# Patient Record
Sex: Female | Born: 1955 | Hispanic: Yes
Health system: Midwestern US, Academic
[De-identification: ages and names within clinical notes are randomized; demographics above are authoritative.]

## PROBLEM LIST (undated history)

## (undated) DIAGNOSIS — I10 Essential (primary) hypertension: Secondary | ICD-10-CM

## (undated) DIAGNOSIS — K5792 Diverticulitis of intestine, part unspecified, without perforation or abscess without bleeding: Secondary | ICD-10-CM

## (undated) HISTORY — PX: APPENDECTOMY: SHX54

---

## 2003-12-23 ENCOUNTER — Other Ambulatory Visit: Payer: Self-pay

## 2004-06-21 ENCOUNTER — Other Ambulatory Visit: Payer: Self-pay

## 2005-10-17 ENCOUNTER — Ambulatory Visit: Payer: Self-pay

## 2006-05-08 ENCOUNTER — Ambulatory Visit: Payer: Self-pay | Admitting: Pediatrics

## 2007-03-14 ENCOUNTER — Other Ambulatory Visit: Payer: Self-pay

## 2007-03-14 ENCOUNTER — Emergency Department: Payer: Self-pay | Admitting: Emergency Medicine

## 2007-08-23 ENCOUNTER — Ambulatory Visit: Payer: Self-pay | Admitting: Internal Medicine

## 2008-04-27 ENCOUNTER — Ambulatory Visit: Payer: Self-pay

## 2008-05-07 ENCOUNTER — Ambulatory Visit: Payer: Self-pay

## 2008-09-27 ENCOUNTER — Ambulatory Visit: Payer: Self-pay | Admitting: Family Medicine

## 2009-12-27 ENCOUNTER — Ambulatory Visit: Payer: Self-pay

## 2013-02-25 ENCOUNTER — Ambulatory Visit: Payer: Self-pay

## 2013-03-23 ENCOUNTER — Ambulatory Visit: Payer: Self-pay

## 2016-02-16 ENCOUNTER — Emergency Department: Admit: 2016-02-17 | Payer: MEDICAID

## 2016-02-16 DIAGNOSIS — M7071 Other bursitis of hip, right hip: Secondary | ICD-10-CM

## 2016-02-16 MED ORDER — traMADol (ULTRAM) 50 mg tablet
50 | ORAL_TABLET | Freq: Four times a day (QID) | ORAL | Status: AC | PRN
Start: 2016-02-16 — End: ?

## 2016-02-16 MED ORDER — methylPREDNISolone (MEDROL DOSEPACK) 4 mg tablet
4 | PACK | ORAL | Status: AC
Start: 2016-02-16 — End: ?

## 2016-02-16 MED ORDER — traMADol (ULTRAM) tablet 50 mg
50 | Freq: Once | ORAL | Status: AC
Start: 2016-02-16 — End: 2016-02-16
  Administered 2016-02-16: 23:00:00 50 mg via ORAL

## 2016-02-16 MED ORDER — predniSONE (DELTASONE) tablet 60 mg
20 | Freq: Once | ORAL | Status: AC
Start: 2016-02-16 — End: 2016-02-16
  Administered 2016-02-17: 03:00:00 60 mg via ORAL

## 2016-02-16 MED FILL — TRAMADOL 50 MG TABLET: 50 50 mg | ORAL | Qty: 1

## 2016-02-16 MED FILL — PREDNISONE 20 MG TABLET: 20 20 MG | ORAL | Qty: 3

## 2016-02-16 NOTE — Unmapped (Signed)
Patient to xray

## 2016-02-16 NOTE — Unmapped (Signed)
Pt with right midline pain x 2 months , worse in last 2 weeks. Pt with lump on outer midline aspect, very painful to palpation.

## 2016-02-16 NOTE — Unmapped (Signed)
Dr. Stettler at bedside

## 2016-02-16 NOTE — Unmapped (Signed)
[place] ED Note    Reason for Visit: Leg Pain and Hip Pain      Patient History     HPI:  Connie Montgomery is a 60 y.o. female who presents with right hip pain that radiates down her leg and has been present for 2 months, although became worse last night. The patient describes a stabbing pain that begins on the outside of her right hip and radiates down her leg.  It does not change with walking and is present with any movement, although does seem to improve somewhat with rest.  The pain has been very intermittent, although became more severe today.  The patient lives in [place] and travels frequently, although is staying in [place] to help care for her grandchildren and is to return to [place] next week.  They have noted no swelling to the leg and the patient denies any chest pain or shortness of breath.  The patient has been using some sort of topical patch to help with the pain, although this does not help to any significant extent.  The patient's noted no fevers and no overlying redness.       Past Medical History   Diagnosis Date   ??? Hypertension    ??? Cancer        Past Surgical History   Procedure Laterality Date   ??? Appendectomy         Connie Montgomery  reports that she has quit smoking. She does not have any smokeless tobacco history on file. She reports that she does not drink alcohol or use illicit drugs.    Previous Medications    HYDROCHLOROTHIAZIDE (MICROZIDE) 12.5 MG CAPSULE    Take 12.5 mg by mouth daily.       Allergies:   Allergies as of 02/16/2016 - Fully Reviewed 02/16/2016   Allergen Reaction Noted   ??? Amoxicillin  02/16/2016       Review of Systems     ROS: Positive as above. All other systems are reviewed and are negative.       Physical Exam     ED Triage Vitals   Vital Signs Group      Temp 02/16/16 2042 96.8 ??F (36 ??C)      Temp Source 02/16/16 2042 Oral      Heart Rate 02/16/16 2042 89      Heart Rate Source 02/16/16 2042 Monitor       Resp 02/16/16 2042 20      SpO2 02/16/16 2042 97 %      BP 02/16/16 2042 147/90 mmHg      BP Location 02/16/16 2042 Right arm      BP Method 02/16/16 2042 Automatic      Patient Position 02/16/16 2042 Sitting   SpO2 02/16/16 2042 97 %   O2 Device 02/16/16 2042 None (Room air)       General: Well-appearing female in no distress.    HEENT: Pupils are equally round and briskly reactive to light.  Extraocular muscles are intact.  Oral mucous membranes are moist without lesions.  There is no evidence of contusion, laceration, or abrasion about the head or neck.    Neck:  No JVD or adenopathy is noted.  The patient has full range of motion without difficulty.    Pulmonary:  Lungs are clear to auscultation bilaterally.  There are no rhonchi rales or wheezes.  There is no subcutaneous air or crepitus.  The chest wall is nontender.  Cardiac:  Regular rate and rhythm.  No murmurs, rubs, or gallops.  Distal pulses are brisk.    Abdomen:  Abdomen is soft, nontender, nondistended.  Bowel sounds are positive.  No organomegaly or masses are appreciated.    Musculoskeletal: Mild to moderate tenderness is noted overlying the right greater trochanter.  There is no overlying skin change or rash.  No warmth or erythema.  There is no swelling noted to the right lower extremity and distal pulses are bounding.  There is no pain with logroll.  Range of motion at the hip and ankle are normal.    Vascular:  Pulses are brisk and equal in all 4 extremities.     Skin: No rashes are noted. Skin is warm, dry, and without lesions    Neuro:  The patient has intact strength in all 4 extremities.  There are no deficits of the cranial nerves.  Gait is normal without assistance.  The visual fields are intact by direct challenge. There is no evidence of ataxia or dysmetria on exam. Speech is fluent without dysarthria.    Psych: Mental status and interactions are normal.      Diagnostic Studies     Labs:    D-dimer is below concerning  threshold.    Radiology:    Plain film of the hip reveals no evidence of fracture, dislocation, or significant arthritic changes      Emergency Department Procedures         ED Course and MDM     Connie Montgomery is a 60 y.o. female who presented to the emergency department with Leg Pain and Hip Pain    The patient presents with a history and exam which are most consistent with a trochanteric bursitis.  Her plain film of the hip is negative and she is able to bear weight.  She underwent a d-dimer, given her travel, despite the absence of leg swelling, which was below the concerning range.  She'll be treated symptomatically for bursitis and advised to return for any worsening symptoms, including swelling, worsening pain, fever, or other concerns.  She will follow up with her primary care physician upon her return home.      Critical Care Time (Attendings)         Claudette Stapler, MD  02/17/16 [phone]

## 2016-02-16 NOTE — Unmapped (Signed)
Patient is post menopausal

## 2016-02-17 ENCOUNTER — Inpatient Hospital Stay: Admit: 2016-02-17 | Discharge: 2016-02-17 | Disposition: A | Discharge status: Home / Self Care | Payer: MEDICAID

## 2016-02-17 LAB — DDIMER: D-Dimer: 0.46 ug/mL FEU (ref 0.00–0.50)

## 2020-08-09 ENCOUNTER — Ambulatory Visit
Admission: EM | Admit: 2020-08-09 | Discharge: 2020-08-09 | Disposition: A | Discharge status: Home / Self Care | Payer: Self-pay | Attending: Family Medicine | Admitting: Family Medicine

## 2020-08-09 ENCOUNTER — Ambulatory Visit (INDEPENDENT_AMBULATORY_CARE_PROVIDER_SITE_OTHER)
Admit: 2020-08-09 | Discharge: 2020-08-09 | Disposition: A | Discharge status: Home / Self Care | Payer: Self-pay | Attending: Family Medicine | Admitting: Family Medicine

## 2020-08-09 ENCOUNTER — Other Ambulatory Visit: Payer: Self-pay

## 2020-08-09 DIAGNOSIS — K5792 Diverticulitis of intestine, part unspecified, without perforation or abscess without bleeding: Secondary | ICD-10-CM

## 2020-08-09 HISTORY — DX: Essential (primary) hypertension: I10

## 2020-08-09 LAB — URINALYSIS, COMPLETE (UACMP) WITH MICROSCOPIC
Bilirubin Urine: NEGATIVE
Glucose, UA: NEGATIVE mg/dL
Ketones, ur: NEGATIVE mg/dL
Leukocytes,Ua: NEGATIVE
Nitrite: NEGATIVE
Protein, ur: NEGATIVE mg/dL
Specific Gravity, Urine: 1.02 (ref 1.005–1.030)
pH: 7 (ref 5.0–8.0)

## 2020-08-09 LAB — CBC WITH DIFFERENTIAL/PLATELET
Abs Immature Granulocytes: 0.04 10*3/uL (ref 0.00–0.07)
Basophils Absolute: 0.1 10*3/uL (ref 0.0–0.1)
Basophils Relative: 1 %
Eosinophils Absolute: 0.1 10*3/uL (ref 0.0–0.5)
Eosinophils Relative: 1 %
HCT: 37.3 % (ref 36.0–46.0)
Hemoglobin: 12.6 g/dL (ref 12.0–15.0)
Immature Granulocytes: 0 %
Lymphocytes Relative: 45 %
Lymphs Abs: 6.3 10*3/uL — ABNORMAL HIGH (ref 0.7–4.0)
MCH: 29.4 pg (ref 26.0–34.0)
MCHC: 33.8 g/dL (ref 30.0–36.0)
MCV: 86.9 fL (ref 80.0–100.0)
Monocytes Absolute: 0.9 10*3/uL (ref 0.1–1.0)
Monocytes Relative: 6 %
Neutro Abs: 6.7 10*3/uL (ref 1.7–7.7)
Neutrophils Relative %: 47 %
Platelets: 225 10*3/uL (ref 150–400)
RBC Morphology: NONE SEEN
RBC: 4.29 MIL/uL (ref 3.87–5.11)
RDW: 13.2 % (ref 11.5–15.5)
Smear Review: NORMAL
WBC Morphology: ABNORMAL
WBC: 14 10*3/uL — ABNORMAL HIGH (ref 4.0–10.5)
nRBC: 0 % (ref 0.0–0.2)

## 2020-08-09 LAB — COMPREHENSIVE METABOLIC PANEL
ALT: 25 U/L (ref 0–44)
AST: 18 U/L (ref 15–41)
Albumin: 3.9 g/dL (ref 3.5–5.0)
Alkaline Phosphatase: 66 U/L (ref 38–126)
Anion gap: 10 (ref 5–15)
BUN: 11 mg/dL (ref 8–23)
CO2: 28 mmol/L (ref 22–32)
Calcium: 8.7 mg/dL — ABNORMAL LOW (ref 8.9–10.3)
Chloride: 99 mmol/L (ref 98–111)
Creatinine, Ser: 0.71 mg/dL (ref 0.44–1.00)
GFR calc Af Amer: >60 mL/min (ref >60)
GFR calc non Af Amer: >60 mL/min (ref >60)
Glucose, Bld: 93 mg/dL (ref 70–99)
Potassium: 3.3 mmol/L — ABNORMAL LOW (ref 3.5–5.1)
Sodium: 137 mmol/L (ref 135–145)
Total Bilirubin: 0.7 mg/dL (ref 0.3–1.2)
Total Protein: 7.9 g/dL (ref 6.5–8.1)

## 2020-08-09 MED ORDER — CIPROFLOXACIN HCL 500 MG PO TABS: 500.0000 mg | ORAL_TABLET | Freq: Two times a day (BID) | ORAL | 0 refills | Status: AC

## 2020-08-09 MED ORDER — METRONIDAZOLE 500 MG PO TABS: 500.0000 mg | ORAL_TABLET | Freq: Three times a day (TID) | ORAL | 0 refills | Status: AC

## 2020-08-09 NOTE — ED Provider Notes (Signed)
[facility]    CSN: [id number] Arrival date & time: 08/09/20  1114      History   Chief Complaint Chief Complaint  Patient presents with  . Urinary Tract Infection   HPI  64 year old female presents with urinary frequency, abdominal pain, and fever.  Symptoms started yesterday.  She reports lower abdominal pain, urinary frequency.  Denies dysuria.  She reports mild back pain.  She had a fever yesterday of 100.5.  Temperature currently 9.9.  Pain 8/10 in severity.  No relieving factors.  Patient is concerned that she has UTI.  No relieving factors.  No nausea vomiting.  No respiratory symptoms.  No other complaints or concerns at this time.  Past Medical History:  Diagnosis Date  . Hypertension    Past Surgical History:  Procedure Laterality Date  . APPENDECTOMY     OB History   No obstetric history on file.    Home Medications    Prior to Admission medications   Medication Sig Start Date End Date Taking? Authorizing Provider  ciprofloxacin (CIPRO) 500 MG tablet Take 1 tablet (500 mg total) by mouth 2 (two) times daily for 10 days. 08/09/20 08/19/20  Coral Spikes, DO  hydrochlorothiazide (HYDRODIURIL) 25 MG tablet Take by mouth.    [provider]  metroNIDAZOLE (FLAGYL) 500 MG tablet Take 1 tablet (500 mg total) by mouth 3 (three) times daily for 10 days. 08/09/20 08/19/20  Coral Spikes, DO   Social History Social History   Tobacco Use  . Smoking status: Never Smoker  . Smokeless tobacco: Never Used  Substance Use Topics  . Alcohol use: Never  . Drug use: Not on file     Allergies   Amoxicillin   Review of Systems Review of Systems  Constitutional: Positive for fever.  Gastrointestinal: Positive for abdominal pain.  Genitourinary: Positive for frequency.   Physical Exam Triage Vital Signs ED Triage Vitals [08/09/20 1221]  Enc Vitals Group     BP (!) 142/81     Pulse Rate (!) 101     Resp 18     Temp 99.9 F (37.7 C)     Temp  Source Oral     SpO2 99 %     Weight 188 lb (85.3 kg)     Height 5' 2.5" (1.588 m)     Head Circumference      Peak Flow      Pain Score 8     Pain Loc      Pain Edu?      Excl. in [place]?    Updated Vital Signs BP (!) 142/81 (BP Location: Right Arm)   Pulse (!) 101   Temp 99.9 F (37.7 C) (Oral)   Resp 18   Ht 5' 2.5" (1.588 m)   Wt 85.3 kg   SpO2 99%   BMI 33.84 kg/m   Visual Acuity Right Eye Distance:   Left Eye Distance:   Bilateral Distance:    Right Eye Near:   Left Eye Near:    Bilateral Near:     Physical Exam Vitals and nursing note reviewed.  Constitutional:      General: She is not in acute distress.    Appearance: Normal appearance. She is not ill-appearing.  HENT:     Head: Normocephalic and atraumatic.  Eyes:     General:        Right eye: No discharge.        Left eye: No discharge.  Conjunctiva/sclera: Conjunctivae normal.  Cardiovascular:     Rate and Rhythm: Regular rhythm. Tachycardia present.  Pulmonary:     Effort: Pulmonary effort is normal.     Breath sounds: Normal breath sounds.  Abdominal:     General: There is no distension.     Palpations: Abdomen is soft.     Comments: Tender location in the suprapubic region as well as the left lower quadrant.  Neurological:     Mental Status: She is alert.  Psychiatric:        Mood and Affect: Mood normal.        Behavior: Behavior normal.    UC Treatments / Results  Labs (all labs ordered are listed, but only abnormal results are displayed) Labs Reviewed  URINALYSIS, COMPLETE (UACMP) WITH MICROSCOPIC - Abnormal; Notable for the following components:      Result Value   Hgb urine dipstick SMALL (*)    Bacteria, UA RARE (*)    All other components within normal limits  CBC WITH DIFFERENTIAL/PLATELET - Abnormal; Notable for the following components:   WBC 14.0 (*)    Lymphs Abs 6.3 (*)    All other components within normal limits  COMPREHENSIVE METABOLIC PANEL - Abnormal; Notable for  the following components:   Potassium 3.3 (*)    Calcium 8.7 (*)    All other components within normal limits  URINE CULTURE  PATHOLOGIST SMEAR REVIEW    EKG   Radiology CT ABDOMEN PELVIS WO CONTRAST  Result Date: 08/09/2020 CLINICAL DATA:  Hematuria.  Fever.  Elevated white blood cell count. EXAM: CT ABDOMEN AND PELVIS WITHOUT CONTRAST TECHNIQUE: Multidetector CT imaging of the abdomen and pelvis was performed following the standard protocol without oral or IV contrast. COMPARISON:  None. FINDINGS: Lower chest: Lung bases are clear. Hepatobiliary: There is hepatic steatosis. No focal liver lesions are evident on this noncontrast enhanced study. The gallbladder wall is not appreciably thickened. There is no biliary duct dilatation. Pancreas: There is no pancreatic mass or inflammatory focus. Spleen: No splenic lesions are evident. Adrenals/Urinary Tract: Adrenals bilaterally appear normal. Kidneys bilaterally show no evident mass or hydronephrosis on either side. There is no appreciable renal or ureteral calculus on either side. Urinary bladder is midline with wall thickness within normal limits. Stomach/Bowel: There are multiple sigmoid diverticula. There is localized wall thickness in the mid sigmoid colon in the left mid abdomen. Irregular diverticulum noted in this area. Adjacent to this area, there is irregular soft tissue stranding with a small focus of loculated fluid measuring 1.4 x 1.6 cm. No well-defined abscess. No extraluminal air in this area. No other bowel wall thickening or bowel inflammatory change noted. No bowel obstruction. The terminal ileum appears normal. There is no appreciable free air or portal venous air. Vascular/Lymphatic: There is no abdominal aortic aneurysm. There is aortic atherosclerosis. No adenopathy is appreciable in the abdomen or pelvis. Reproductive: The uterus is anteverted. A small calcification in the anterior uterus may represent a tiny leiomyoma. No other  pelvic mass. Note that inflammation from the diverticulitis in the sigmoid colon is adjacent to the left adnexa. Other: Appendix region appears normal without inflammation. There are calcifications in the periappendiceal region. There is no frank abscess in the abdomen pelvis. No ascites evident in the abdomen or pelvis. Musculoskeletal: No blastic or lytic bone lesions. There are foci of degenerative change in the lumbar spine. There are no intramuscular or abdominal wall lesions. IMPRESSION: 1. Diverticulitis in the mid sigmoid colon region.  There is focal soft tissue stranding and mild fluid posterior to and irregular diverticulum in the mid sigmoid colon, likely sequelae of diverticulitis. No extraluminal air or well-defined abscess in this area. Early developing phlegmon in this area must be of concern, however. 2. No bowel obstruction. No frank abscess in the abdomen or pelvis. No periappendiceal region inflammation. 3. No renal or ureteral calculus. No hydronephrosis. Urinary bladder wall thickness normal. 4.  Hepatic steatosis. 5.  Aortic Atherosclerosis (ICD10-I70.0). These results will be called to the ordering clinician or representative by the Radiologist Assistant, and communication documented in the PACS or [employer]. Electronically Signed   By: Lowella Grip III M.D.   On: 08/09/2020 14:12    Procedures Procedures (including critical care time)  Medications Ordered in UC Medications - No data to display  Initial Impression / Assessment and Plan / UC Course  I have reviewed the triage vital signs and the nursing notes.  Pertinent labs & imaging results that were available during my care of the patient were reviewed by me and considered in my medical decision making (see chart for details).    64 year old female presents with fever and lower abdominal pain.  Patient concern for UTI.  Urinalysis not consistent with UTI.  Awaiting culture.  Labs obtained and revealed leukocytosis  of 14.  CT abdomen pelvis was obtained and revealed acute diverticulitis of the sigmoid colon and concern for possible developing phlegmon.  Case was discussed with general surgery Dr. Hampton Abbot.  He looked at the CT scan and did not feel that she warranted surgical intervention and hospitalization at this time.  He agreed with outpatient antibiotics and clear liquid diet.  Treating with Cipro and Flagyl.  Clear liquid diet for 48 hours.  Patient will be reevaluated here in 48 hours.  She is to go to the hospital if she worsens.  Final Clinical Impressions(s) / UC Diagnoses   Final diagnoses:  Acute diverticulitis     Discharge Instructions     Medication as directed.  Clear liquid diet for 2 days.  If you worsen, go to the ER.  Take care  Dr. Lacinda Axon    ED Prescriptions    Medication Sig Dispense Auth. Provider   ciprofloxacin (CIPRO) 500 MG tablet Take 1 tablet (500 mg total) by mouth 2 (two) times daily for 10 days. 20 tablet Delontae Lamm G, DO   metroNIDAZOLE (FLAGYL) 500 MG tablet Take 1 tablet (500 mg total) by mouth 3 (three) times daily for 10 days. 30 tablet Coral Spikes, DO     PDMP not reviewed this encounter.   Coral Spikes, DO 08/09/20 1455

## 2020-08-09 NOTE — ED Triage Notes (Signed)
Patient in today w/ c/o UTI sx, w/ frequency, and a fever. Sx onset yesterday.

## 2020-08-09 NOTE — Discharge Instructions (Signed)
Medication as directed.  Clear liquid diet for 2 days.  If you worsen, go to the ER.  Take care  Dr. Lacinda Axon

## 2020-08-10 LAB — URINE CULTURE

## 2020-08-10 LAB — PATHOLOGIST SMEAR REVIEW

## 2020-08-11 ENCOUNTER — Ambulatory Visit
Admission: EM | Admit: 2020-08-11 | Discharge: 2020-08-11 | Disposition: A | Discharge status: Home / Self Care | Payer: Self-pay | Attending: Family Medicine | Admitting: Family Medicine

## 2020-08-11 ENCOUNTER — Other Ambulatory Visit: Payer: Self-pay

## 2020-08-11 DIAGNOSIS — K5792 Diverticulitis of intestine, part unspecified, without perforation or abscess without bleeding: Secondary | ICD-10-CM

## 2020-08-11 HISTORY — DX: Diverticulitis of intestine, part unspecified, without perforation or abscess without bleeding: K57.92

## 2020-08-11 MED ORDER — ONDANSETRON 4 MG PO TBDP: 4.0000 mg | ORAL_TABLET | Freq: Three times a day (TID) | ORAL | 0 refills | Status: DC | PRN

## 2020-08-11 NOTE — ED Triage Notes (Addendum)
Pt reports she is here for follow up after being seen for abdominal pain. Pt has been taking clear liquids and jello for past 3 days. Pain is very mild compared to when she was hear before. Endorses nausea

## 2020-08-11 NOTE — ED Provider Notes (Signed)
[facility]    CSN: [id number] Arrival date & time: 08/11/20  1113      History   Chief Complaint Chief Complaint  Patient presents with  . Follow-up   HPI  64 year old female presents for follow up.  Patient recently seen by me on 9/14.  CT scan was done and revealed diverticulitis with possible developing phlegmon.  Case was discussed with general surgery.  He reviewed the CT scan and felt comfortable with outpatient management.  Patient reports compliance with prescribed antibiotics.  She reports that she has been eating clear liquid diet as directed.  She reports associated nausea from the medication.  Abdominal pain improved.  No fever.  Pain currently 3/10 in severity.  No other associated symptoms.  No other complaints.  Past Medical History:  Diagnosis Date  . Diverticulitis   . Hypertension    Past Surgical History:  Procedure Laterality Date  . APPENDECTOMY      OB History   No obstetric history on file.      Home Medications    Prior to Admission medications   Medication Sig Start Date End Date Taking? Authorizing Provider  ciprofloxacin (CIPRO) 500 MG tablet Take 1 tablet (500 mg total) by mouth 2 (two) times daily for 10 days. 08/09/20 08/19/20  Coral Spikes, DO  hydrochlorothiazide (HYDRODIURIL) 25 MG tablet Take by mouth.    [provider]  metroNIDAZOLE (FLAGYL) 500 MG tablet Take 1 tablet (500 mg total) by mouth 3 (three) times daily for 10 days. 08/09/20 08/19/20  Coral Spikes, DO  ondansetron (ZOFRAN ODT) 4 MG disintegrating tablet Take 1 tablet (4 mg total) by mouth every 8 (eight) hours as needed for nausea or vomiting. 08/11/20   Coral Spikes, DO   Social History Social History   Tobacco Use  . Smoking status: Never Smoker  . Smokeless tobacco: Never Used  Vaping Use  . Vaping Use: Never used  Substance Use Topics  . Alcohol use: Not Currently  . Drug use: Never     Allergies   Amoxicillin   Review of  Systems Review of Systems  Constitutional: Negative for fever.  Gastrointestinal: Positive for abdominal pain and nausea.   Physical Exam Triage Vital Signs ED Triage Vitals  Enc Vitals Group     BP 08/11/20 1141 130/61     Pulse Rate 08/11/20 1141 79     Resp 08/11/20 1141 18     Temp 08/11/20 1141 98.3 F (36.8 C)     Temp Source 08/11/20 1141 Oral     SpO2 08/11/20 1141 99 %     Weight 08/11/20 1140 187 lb 13.3 oz (85.2 kg)     Height 08/11/20 1140 5' 2.5" (1.588 m)     Head Circumference --      Peak Flow --      Pain Score 08/11/20 1139 3     Pain Loc --      Pain Edu? --      Excl. in [place]? --    Updated Vital Signs BP 130/61 (BP Location: Right Arm)   Pulse 79   Temp 98.3 F (36.8 C) (Oral)   Resp 18   Ht 5' 2.5" (1.588 m)   Wt 85.2 kg   SpO2 99%   BMI 33.81 kg/m   Visual Acuity Right Eye Distance:   Left Eye Distance:   Bilateral Distance:    Right Eye Near:   Left Eye Near:  Bilateral Near:     Physical Exam Vitals and nursing note reviewed.  Constitutional:      General: She is not in acute distress.    Appearance: Normal appearance. She is not ill-appearing.  HENT:     Head: Normocephalic and atraumatic.  Eyes:     General:        Right eye: No discharge.        Left eye: No discharge.     Conjunctiva/sclera: Conjunctivae normal.  Pulmonary:     Effort: Pulmonary effort is normal. No respiratory distress.  Abdominal:     Palpations: Abdomen is soft.     Comments: Mild tenderness in the left lower quadrant  Neurological:     Mental Status: She is alert.  Psychiatric:        Mood and Affect: Mood normal.        Behavior: Behavior normal.    UC Treatments / Results  Labs (all labs ordered are listed, but only abnormal results are displayed) Labs Reviewed - No data to display  EKG   Radiology CT ABDOMEN PELVIS WO CONTRAST  Result Date: 08/09/2020 CLINICAL DATA:  Hematuria.  Fever.  Elevated white blood cell count. EXAM: CT  ABDOMEN AND PELVIS WITHOUT CONTRAST TECHNIQUE: Multidetector CT imaging of the abdomen and pelvis was performed following the standard protocol without oral or IV contrast. COMPARISON:  None. FINDINGS: Lower chest: Lung bases are clear. Hepatobiliary: There is hepatic steatosis. No focal liver lesions are evident on this noncontrast enhanced study. The gallbladder wall is not appreciably thickened. There is no biliary duct dilatation. Pancreas: There is no pancreatic mass or inflammatory focus. Spleen: No splenic lesions are evident. Adrenals/Urinary Tract: Adrenals bilaterally appear normal. Kidneys bilaterally show no evident mass or hydronephrosis on either side. There is no appreciable renal or ureteral calculus on either side. Urinary bladder is midline with wall thickness within normal limits. Stomach/Bowel: There are multiple sigmoid diverticula. There is localized wall thickness in the mid sigmoid colon in the left mid abdomen. Irregular diverticulum noted in this area. Adjacent to this area, there is irregular soft tissue stranding with a small focus of loculated fluid measuring 1.4 x 1.6 cm. No well-defined abscess. No extraluminal air in this area. No other bowel wall thickening or bowel inflammatory change noted. No bowel obstruction. The terminal ileum appears normal. There is no appreciable free air or portal venous air. Vascular/Lymphatic: There is no abdominal aortic aneurysm. There is aortic atherosclerosis. No adenopathy is appreciable in the abdomen or pelvis. Reproductive: The uterus is anteverted. A small calcification in the anterior uterus may represent a tiny leiomyoma. No other pelvic mass. Note that inflammation from the diverticulitis in the sigmoid colon is adjacent to the left adnexa. Other: Appendix region appears normal without inflammation. There are calcifications in the periappendiceal region. There is no frank abscess in the abdomen pelvis. No ascites evident in the abdomen or  pelvis. Musculoskeletal: No blastic or lytic bone lesions. There are foci of degenerative change in the lumbar spine. There are no intramuscular or abdominal wall lesions. IMPRESSION: 1. Diverticulitis in the mid sigmoid colon region. There is focal soft tissue stranding and mild fluid posterior to and irregular diverticulum in the mid sigmoid colon, likely sequelae of diverticulitis. No extraluminal air or well-defined abscess in this area. Early developing phlegmon in this area must be of concern, however. 2. No bowel obstruction. No frank abscess in the abdomen or pelvis. No periappendiceal region inflammation. 3. No renal or ureteral  calculus. No hydronephrosis. Urinary bladder wall thickness normal. 4.  Hepatic steatosis. 5.  Aortic Atherosclerosis (ICD10-I70.0). These results will be called to the ordering clinician or representative by the Radiologist Assistant, and communication documented in the PACS or [employer]. Electronically Signed   By: Lowella Grip III M.D.   On: 08/09/2020 14:12    Procedures Procedures (including critical care time)  Medications Ordered in UC Medications - No data to display  Initial Impression / Assessment and Plan / UC Course  I have reviewed the triage vital signs and the nursing notes.  Pertinent labs & imaging results that were available during my care of the patient were reviewed by me and considered in my medical decision making (see chart for details).    63 year old female presents for follow-up regarding acute diverticulitis.  She is improving and doing well at this time.  Finish antibiotic course.  Zofran as needed.  Supportive care.  May start with a bland diet.  Final Clinical Impressions(s) / UC Diagnoses   Final diagnoses:  Acute diverticulitis   Discharge Instructions   None    ED Prescriptions    Medication Sig Dispense Auth. Provider   ondansetron (ZOFRAN ODT) 4 MG disintegrating tablet  (Status: Discontinued) Take 1 tablet  (4 mg total) by mouth every 8 (eight) hours as needed for nausea or vomiting. 20 tablet Jamisyn Langer G, DO   ondansetron (ZOFRAN ODT) 4 MG disintegrating tablet Take 1 tablet (4 mg total) by mouth every 8 (eight) hours as needed for nausea or vomiting. 20 tablet Coral Spikes, DO     PDMP not reviewed this encounter.   Coral Spikes, [place] 08/11/20 1352

## 2020-12-02 ENCOUNTER — Encounter: Payer: Self-pay | Admitting: Emergency Medicine

## 2020-12-02 ENCOUNTER — Emergency Department
Admission: EM | Admit: 2020-12-02 | Discharge: 2020-12-02 | Disposition: A | Discharge status: Home / Self Care | Payer: BLUE CROSS/BLUE SHIELD | Attending: Emergency Medicine | Admitting: Emergency Medicine

## 2020-12-02 ENCOUNTER — Other Ambulatory Visit: Payer: Self-pay

## 2020-12-02 DIAGNOSIS — R110 Nausea: Secondary | ICD-10-CM | POA: Insufficient documentation

## 2020-12-02 DIAGNOSIS — I10 Essential (primary) hypertension: Secondary | ICD-10-CM | POA: Insufficient documentation

## 2020-12-02 DIAGNOSIS — R42 Dizziness and giddiness: Secondary | ICD-10-CM | POA: Diagnosis present

## 2020-12-02 LAB — CBG MONITORING, ED: Glucose-Capillary: 89 mg/dL (ref 70–99)

## 2020-12-02 NOTE — ED Triage Notes (Signed)
Pt to ED via POV with c/o dizziness x 1.5 hours this morning after drinking 2 cups of really strong coffee and eating a piece of bread. Pt states took her medication and drank some apple juice then became nauseated. Pt denies nausea and dizziness at this time, states now she just feels hungry. Pt A&O x4, able to speak in full/complete and clear sentences at this time.

## 2020-12-02 NOTE — ED Triage Notes (Signed)
First NUrse Note:  C/O dizziness and vertigo this morning.  Has history of vertigo.  AAOx3.  Skin warm and dry.

## 2020-12-02 NOTE — ED Provider Notes (Signed)
[facility] Emergency Department Provider Note  ____________________________________________   Event Date/Time   First MD Initiated Contact with Patient 12/02/20 1910     (approximate)  I have reviewed the triage vital signs and the nursing notes.   HISTORY  Chief Complaint Dizziness    HPI Kayla Pope is a 65 y.o. female with history of leukemia in remission and currently getting treated for diverticulitis who comes in with dizziness.  Patient states that she has a history of vertigo.  States that this morning she drank 2 strong coffees and she started feeling dizzy and had some nausea.  No vomiting.  She states that her symptoms resolved after going to the emergency room.  She denies any chest pain or shortness of breath.  No prior cardiac history.  She is able to ambulate normally.  No history of strokes.  No history of cardiac disease.  She feels that her baseline self.          Past Medical History:  Diagnosis Date  . Diverticulitis   . Hypertension     There are no problems to display for this patient.   Past Surgical History:  Procedure Laterality Date  . APPENDECTOMY      Prior to Admission medications   Medication Sig Start Date End Date Taking? Authorizing Provider  hydrochlorothiazide (HYDRODIURIL) 25 MG tablet Take by mouth.    [provider]  ondansetron (ZOFRAN ODT) 4 MG disintegrating tablet Take 1 tablet (4 mg total) by mouth every 8 (eight) hours as needed for nausea or vomiting. 08/11/20   Coral Spikes, DO    Allergies Amoxicillin  History reviewed. No pertinent family history.  Social History Social History   Tobacco Use  . Smoking status: Never Smoker  . Smokeless tobacco: Never Used  Vaping Use  . Vaping Use: Never used  Substance Use Topics  . Alcohol use: Not Currently  . Drug use: Never      Review of Systems Constitutional: No fever/chills, dizziness Eyes: No visual changes. ENT:  No sore throat. Cardiovascular: Denies chest pain. Respiratory: Denies shortness of breath. Gastrointestinal: No abdominal pain.  Positive nausea, no vomiting.  No diarrhea.  No constipation. Genitourinary: Negative for dysuria. Musculoskeletal: Negative for back pain. Skin: Negative for rash. Neurological: Negative for headaches, focal weakness or numbness. All other ROS negative ____________________________________________   PHYSICAL EXAM:  VITAL SIGNS: ED Triage Vitals  Enc Vitals Group     BP 12/02/20 1536 (!) 158/70     Pulse Rate 12/02/20 1536 70     Resp 12/02/20 1536 18     Temp 12/02/20 1536 98.9 F (37.2 C)     Temp Source 12/02/20 1536 Oral     SpO2 12/02/20 1536 99 %     Weight 12/02/20 1536 173 lb (78.5 kg)     Height 12/02/20 1536 5\' 2"  (1.575 m)     Head Circumference --      Peak Flow --      Pain Score 12/02/20 1542 0     Pain Loc --      Pain Edu? --      Excl. in [place]? --     Constitutional: Alert and oriented. Well appearing and in no acute distress. Eyes: Conjunctivae are normal. EOMI. Head: Atraumatic. Nose: No congestion/rhinnorhea. Mouth/Throat: Mucous membranes are moist.   Neck: No stridor. Trachea Midline. FROM Cardiovascular: Normal rate, regular rhythm. Grossly normal heart sounds.  Good peripheral circulation. Respiratory: Normal respiratory  effort.  No retractions. Lungs CTAB. Gastrointestinal: Soft and nontender. No distention. No abdominal bruits.  Musculoskeletal: No lower extremity tenderness nor edema.  No joint effusions. Neurologic:  Normal speech and language. No gross focal neurologic deficits are appreciated.  Cranial nerves II to XII are intact.  Finger-to-nose is normal.  Heel-to-shin is normal.  Able to ambulate without difficulties. Skin:  Skin is warm, dry and intact. No rash noted. Psychiatric: Mood and affect are normal. Speech and behavior are normal. GU: Deferred   ____________________________________________    LABS (all labs ordered are listed, but only abnormal results are displayed)  Labs Reviewed  CBG MONITORING, ED   ____________________________________________   ED ECG REPORT I, Vanessa [place], the attending physician, personally viewed and interpreted this ECG.  EKG normal sinus, no ST elevation, no T wave inversions, normal intervals ____________________________________________  INITIAL IMPRESSION / ASSESSMENT AND PLAN / ED COURSE  [name] was evaluated in Emergency Department on 12/02/2020 for the symptoms described in the history of present illness. She was evaluated in the context of the global COVID-19 pandemic, which necessitated consideration that the patient might be at risk for infection with the SARS-CoV-2 virus that causes COVID-19. Institutional protocols and algorithms that pertain to the evaluation of patients at risk for COVID-19 are in a state of rapid change based on information released by regulatory bodies including the CDC and federal and state organizations. These policies and algorithms were followed during the patient's care in the ED.     Patient is a very well-appearing 65 year old who reports getting dizzy and nauseous after drinking 2 large cups of strong coffee.  Patient symptoms have resolved and has been in the emergency room for over 5 hours without recurrent symptoms.  I have very low suspicion for cardiac disease given she had no chest pain and her symptoms have since resolved.  Her EKG looks similar to prior and at this point she is not interested in any blood work which I think is reasonable given her very low suspicion.  I also have very low suspicion for neurological process.  Her neuro exam is intact and she is ambulating well and she has no symptoms currently therefore low suspicion for stroke or dissection.  Glucose was ordered and she was not hypoglycemic or hyperglycemic.  At this time patient states that she feels at her baseline self and she is  just hungry and would like to go home and understands that if her symptoms are to return or she develops new symptoms such as chest pain she should return to the ER immediately      ____________________________________________   FINAL CLINICAL IMPRESSION(S) / ED DIAGNOSES   Final diagnoses:  Dizziness      MEDICATIONS GIVEN DURING THIS VISIT:  Medications - No data to display   ED Discharge Orders    None       Note:  This document was prepared using Dragon voice recognition software and may include unintentional dictation errors.   Vanessa [place], MD 12/02/20 2004

## 2020-12-02 NOTE — Discharge Instructions (Signed)
Your EKG and glucose were normal.  If you have return of your symptoms or you start developing chest pain you should return to the ER immediately for further work-up

## 2022-02-05 DIAGNOSIS — H16223 Keratoconjunctivitis sicca, not specified as Sjogren's, bilateral: Secondary | ICD-10-CM | POA: Diagnosis not present

## 2022-04-16 ENCOUNTER — Other Ambulatory Visit: Payer: Self-pay | Admitting: Family Medicine

## 2022-04-16 DIAGNOSIS — R7309 Other abnormal glucose: Secondary | ICD-10-CM | POA: Diagnosis not present

## 2022-04-16 DIAGNOSIS — C91Z1 Other lymphoid leukemia, in remission: Secondary | ICD-10-CM | POA: Diagnosis not present

## 2022-04-16 DIAGNOSIS — Z0000 Encounter for general adult medical examination without abnormal findings: Secondary | ICD-10-CM | POA: Diagnosis not present

## 2022-04-16 DIAGNOSIS — Z7689 Persons encountering health services in other specified circumstances: Secondary | ICD-10-CM | POA: Diagnosis not present

## 2022-04-16 DIAGNOSIS — F411 Generalized anxiety disorder: Secondary | ICD-10-CM | POA: Diagnosis not present

## 2022-04-16 DIAGNOSIS — M797 Fibromyalgia: Secondary | ICD-10-CM | POA: Diagnosis not present

## 2022-04-16 DIAGNOSIS — N898 Other specified noninflammatory disorders of vagina: Secondary | ICD-10-CM | POA: Diagnosis not present

## 2022-04-16 DIAGNOSIS — Z118 Encounter for screening for other infectious and parasitic diseases: Secondary | ICD-10-CM | POA: Diagnosis not present

## 2022-04-16 DIAGNOSIS — Z1283 Encounter for screening for malignant neoplasm of skin: Secondary | ICD-10-CM | POA: Diagnosis not present

## 2022-04-16 DIAGNOSIS — I10 Essential (primary) hypertension: Secondary | ICD-10-CM | POA: Diagnosis not present

## 2022-04-16 DIAGNOSIS — Z1231 Encounter for screening mammogram for malignant neoplasm of breast: Secondary | ICD-10-CM

## 2022-05-14 DIAGNOSIS — M8588 Other specified disorders of bone density and structure, other site: Secondary | ICD-10-CM | POA: Diagnosis not present

## 2022-05-15 ENCOUNTER — Ambulatory Visit
Admission: RE | Admit: 2022-05-15 | Discharge: 2022-05-15 | Disposition: A | Discharge status: Home / Self Care | Payer: Medicare HMO | Source: Ambulatory Visit | Attending: Family Medicine | Admitting: Family Medicine

## 2022-05-15 DIAGNOSIS — Z1231 Encounter for screening mammogram for malignant neoplasm of breast: Secondary | ICD-10-CM | POA: Insufficient documentation

## 2022-05-23 ENCOUNTER — Other Ambulatory Visit: Payer: Self-pay | Admitting: Family Medicine

## 2022-05-23 DIAGNOSIS — R928 Other abnormal and inconclusive findings on diagnostic imaging of breast: Secondary | ICD-10-CM

## 2022-05-29 DIAGNOSIS — R300 Dysuria: Secondary | ICD-10-CM | POA: Diagnosis not present

## 2022-05-29 DIAGNOSIS — N390 Urinary tract infection, site not specified: Secondary | ICD-10-CM | POA: Diagnosis not present

## 2022-06-04 ENCOUNTER — Ambulatory Visit
Admission: RE | Admit: 2022-06-04 | Discharge: 2022-06-04 | Disposition: A | Discharge status: Home / Self Care | Payer: Medicare HMO | Source: Ambulatory Visit | Attending: Family Medicine | Admitting: Family Medicine

## 2022-06-04 DIAGNOSIS — R928 Other abnormal and inconclusive findings on diagnostic imaging of breast: Secondary | ICD-10-CM | POA: Insufficient documentation

## 2022-06-04 DIAGNOSIS — R921 Mammographic calcification found on diagnostic imaging of breast: Secondary | ICD-10-CM | POA: Diagnosis not present

## 2022-06-07 ENCOUNTER — Other Ambulatory Visit: Payer: Self-pay | Admitting: Infectious Diseases

## 2022-06-07 DIAGNOSIS — R928 Other abnormal and inconclusive findings on diagnostic imaging of breast: Secondary | ICD-10-CM

## 2022-06-12 DIAGNOSIS — R69 Illness, unspecified: Secondary | ICD-10-CM | POA: Diagnosis not present

## 2022-06-21 ENCOUNTER — Ambulatory Visit
Admission: RE | Admit: 2022-06-21 | Discharge: 2022-06-21 | Disposition: A | Discharge status: Home / Self Care | Payer: Medicare HMO | Source: Ambulatory Visit | Attending: Infectious Diseases | Admitting: Infectious Diseases

## 2022-06-21 DIAGNOSIS — R928 Other abnormal and inconclusive findings on diagnostic imaging of breast: Secondary | ICD-10-CM

## 2022-06-21 DIAGNOSIS — R921 Mammographic calcification found on diagnostic imaging of breast: Secondary | ICD-10-CM | POA: Diagnosis not present

## 2022-06-21 DIAGNOSIS — N6092 Unspecified benign mammary dysplasia of left breast: Secondary | ICD-10-CM | POA: Diagnosis not present

## 2022-06-21 HISTORY — PX: BREAST BIOPSY: SHX20

## 2022-06-22 ENCOUNTER — Encounter: Payer: Self-pay | Admitting: *Deleted

## 2022-06-22 LAB — SURGICAL PATHOLOGY

## 2022-06-22 NOTE — Progress Notes (Unsigned)
Referral recieved from [facility] for benign breast mass.  Appointment scheduled for surgical consultation.   No further needs at this time.

## 2022-07-10 DIAGNOSIS — N6092 Unspecified benign mammary dysplasia of left breast: Secondary | ICD-10-CM | POA: Diagnosis not present

## 2022-09-15 DIAGNOSIS — R0981 Nasal congestion: Secondary | ICD-10-CM | POA: Diagnosis not present

## 2022-09-15 DIAGNOSIS — Z28310 Unvaccinated for covid-19: Secondary | ICD-10-CM | POA: Diagnosis not present

## 2022-09-15 DIAGNOSIS — U071 COVID-19: Secondary | ICD-10-CM | POA: Diagnosis not present

## 2022-09-23 DIAGNOSIS — Z8616 Personal history of COVID-19: Secondary | ICD-10-CM | POA: Diagnosis not present

## 2022-09-23 DIAGNOSIS — Z20822 Contact with and (suspected) exposure to covid-19: Secondary | ICD-10-CM | POA: Diagnosis not present

## 2022-09-23 DIAGNOSIS — R059 Cough, unspecified: Secondary | ICD-10-CM | POA: Diagnosis not present

## 2022-11-07 DIAGNOSIS — I10 Essential (primary) hypertension: Secondary | ICD-10-CM | POA: Diagnosis not present

## 2022-11-07 DIAGNOSIS — R300 Dysuria: Secondary | ICD-10-CM | POA: Diagnosis not present

## 2022-12-27 DIAGNOSIS — R519 Headache, unspecified: Secondary | ICD-10-CM | POA: Diagnosis not present

## 2022-12-27 DIAGNOSIS — R350 Frequency of micturition: Secondary | ICD-10-CM | POA: Diagnosis not present

## 2022-12-27 DIAGNOSIS — N390 Urinary tract infection, site not specified: Secondary | ICD-10-CM | POA: Diagnosis not present

## 2022-12-27 DIAGNOSIS — R5383 Other fatigue: Secondary | ICD-10-CM | POA: Diagnosis not present

## 2023-01-28 DIAGNOSIS — R059 Cough, unspecified: Secondary | ICD-10-CM | POA: Diagnosis not present

## 2023-01-28 DIAGNOSIS — R030 Elevated blood-pressure reading, without diagnosis of hypertension: Secondary | ICD-10-CM | POA: Diagnosis not present

## 2023-01-28 DIAGNOSIS — R5383 Other fatigue: Secondary | ICD-10-CM | POA: Diagnosis not present

## 2023-01-28 DIAGNOSIS — R051 Acute cough: Secondary | ICD-10-CM | POA: Diagnosis not present

## 2023-01-28 DIAGNOSIS — Z03818 Encounter for observation for suspected exposure to other biological agents ruled out: Secondary | ICD-10-CM | POA: Diagnosis not present

## 2023-01-28 DIAGNOSIS — J22 Unspecified acute lower respiratory infection: Secondary | ICD-10-CM | POA: Diagnosis not present

## 2023-01-31 DIAGNOSIS — R002 Palpitations: Secondary | ICD-10-CM | POA: Diagnosis not present

## 2023-01-31 DIAGNOSIS — R0789 Other chest pain: Secondary | ICD-10-CM | POA: Diagnosis not present

## 2023-01-31 DIAGNOSIS — D72829 Elevated white blood cell count, unspecified: Secondary | ICD-10-CM | POA: Diagnosis not present

## 2023-01-31 DIAGNOSIS — R42 Dizziness and giddiness: Secondary | ICD-10-CM | POA: Diagnosis not present

## 2023-01-31 DIAGNOSIS — Q2546 Tortuous aortic arch: Secondary | ICD-10-CM | POA: Diagnosis not present

## 2023-01-31 DIAGNOSIS — R000 Tachycardia, unspecified: Secondary | ICD-10-CM | POA: Diagnosis not present

## 2023-01-31 DIAGNOSIS — I119 Hypertensive heart disease without heart failure: Secondary | ICD-10-CM | POA: Diagnosis not present

## 2023-02-06 DIAGNOSIS — D72820 Lymphocytosis (symptomatic): Secondary | ICD-10-CM | POA: Diagnosis not present

## 2023-02-06 DIAGNOSIS — I10 Essential (primary) hypertension: Secondary | ICD-10-CM | POA: Diagnosis not present

## 2023-02-06 DIAGNOSIS — J189 Pneumonia, unspecified organism: Secondary | ICD-10-CM | POA: Diagnosis not present

## 2023-02-06 DIAGNOSIS — J302 Other seasonal allergic rhinitis: Secondary | ICD-10-CM | POA: Diagnosis not present

## 2023-02-18 DIAGNOSIS — D72820 Lymphocytosis (symptomatic): Secondary | ICD-10-CM | POA: Diagnosis not present

## 2023-02-19 DIAGNOSIS — D72820 Lymphocytosis (symptomatic): Secondary | ICD-10-CM | POA: Diagnosis not present

## 2023-02-19 DIAGNOSIS — Z856 Personal history of leukemia: Secondary | ICD-10-CM | POA: Diagnosis not present

## 2023-02-19 DIAGNOSIS — M549 Dorsalgia, unspecified: Secondary | ICD-10-CM | POA: Diagnosis not present

## 2023-02-19 DIAGNOSIS — C91Z0 Other lymphoid leukemia not having achieved remission: Secondary | ICD-10-CM | POA: Diagnosis not present

## 2023-03-07 DIAGNOSIS — N898 Other specified noninflammatory disorders of vagina: Secondary | ICD-10-CM | POA: Diagnosis not present

## 2023-03-12 DIAGNOSIS — C91Z0 Other lymphoid leukemia not having achieved remission: Secondary | ICD-10-CM | POA: Diagnosis not present

## 2023-03-28 DIAGNOSIS — H1013 Acute atopic conjunctivitis, bilateral: Secondary | ICD-10-CM | POA: Diagnosis not present

## 2023-05-06 DIAGNOSIS — I10 Essential (primary) hypertension: Secondary | ICD-10-CM | POA: Diagnosis not present

## 2023-05-06 DIAGNOSIS — R7303 Prediabetes: Secondary | ICD-10-CM | POA: Diagnosis not present

## 2023-05-06 DIAGNOSIS — C91Z0 Other lymphoid leukemia not having achieved remission: Secondary | ICD-10-CM | POA: Diagnosis not present

## 2023-06-12 DIAGNOSIS — Z0000 Encounter for general adult medical examination without abnormal findings: Secondary | ICD-10-CM | POA: Diagnosis not present

## 2023-06-12 DIAGNOSIS — R7303 Prediabetes: Secondary | ICD-10-CM | POA: Diagnosis not present

## 2023-06-12 DIAGNOSIS — M545 Low back pain, unspecified: Secondary | ICD-10-CM | POA: Diagnosis not present

## 2023-06-12 DIAGNOSIS — M5430 Sciatica, unspecified side: Secondary | ICD-10-CM | POA: Diagnosis not present

## 2023-06-12 DIAGNOSIS — M47816 Spondylosis without myelopathy or radiculopathy, lumbar region: Secondary | ICD-10-CM | POA: Diagnosis not present

## 2023-06-12 DIAGNOSIS — M4316 Spondylolisthesis, lumbar region: Secondary | ICD-10-CM | POA: Diagnosis not present

## 2023-07-02 DIAGNOSIS — M5451 Vertebrogenic low back pain: Secondary | ICD-10-CM | POA: Diagnosis not present

## 2023-07-02 DIAGNOSIS — M6281 Muscle weakness (generalized): Secondary | ICD-10-CM | POA: Diagnosis not present

## 2023-07-03 DIAGNOSIS — H31102 Choroidal degeneration, unspecified, left eye: Secondary | ICD-10-CM | POA: Diagnosis not present

## 2023-07-03 DIAGNOSIS — H5203 Hypermetropia, bilateral: Secondary | ICD-10-CM | POA: Diagnosis not present

## 2023-07-03 DIAGNOSIS — H52223 Regular astigmatism, bilateral: Secondary | ICD-10-CM | POA: Diagnosis not present

## 2023-07-03 DIAGNOSIS — H04123 Dry eye syndrome of bilateral lacrimal glands: Secondary | ICD-10-CM | POA: Diagnosis not present

## 2023-07-13 DIAGNOSIS — N3001 Acute cystitis with hematuria: Secondary | ICD-10-CM | POA: Diagnosis not present

## 2023-07-23 DIAGNOSIS — N390 Urinary tract infection, site not specified: Secondary | ICD-10-CM | POA: Diagnosis not present

## 2023-07-23 DIAGNOSIS — R399 Unspecified symptoms and signs involving the genitourinary system: Secondary | ICD-10-CM | POA: Diagnosis not present

## 2023-07-23 DIAGNOSIS — R252 Cramp and spasm: Secondary | ICD-10-CM | POA: Diagnosis not present

## 2023-08-06 ENCOUNTER — Ambulatory Visit: Payer: Medicare HMO | Admitting: Urology

## 2023-08-06 ENCOUNTER — Encounter: Payer: Self-pay | Admitting: Urology

## 2023-08-06 ENCOUNTER — Ambulatory Visit (INDEPENDENT_AMBULATORY_CARE_PROVIDER_SITE_OTHER): Payer: Medicare HMO | Admitting: Urology

## 2023-08-06 VITALS — BP 173/83 | HR 85 | Ht 62.5 in | Wt 189.6 lb

## 2023-08-06 DIAGNOSIS — N898 Other specified noninflammatory disorders of vagina: Secondary | ICD-10-CM | POA: Diagnosis not present

## 2023-08-06 DIAGNOSIS — N958 Other specified menopausal and perimenopausal disorders: Secondary | ICD-10-CM

## 2023-08-06 DIAGNOSIS — N390 Urinary tract infection, site not specified: Secondary | ICD-10-CM

## 2023-08-06 DIAGNOSIS — Z87440 Personal history of urinary (tract) infections: Secondary | ICD-10-CM

## 2023-08-06 MED ORDER — ESTRADIOL 0.1 MG/GM VA CREA
TOPICAL_CREAM | VAGINAL | 12 refills | Status: DC
Start: 2023-08-06 — End: 2024-02-26

## 2023-08-06 NOTE — Progress Notes (Signed)
   08/06/23 1:10 PM   Kayla Pope 25-Apr-1956 [id number]  CC: " Recurrent UTI," urinary symptoms, incontinence  HPI: 67 year old female who reports a year of symptoms including vaginal dryness, reported UTIs but multiple numerous negative cultures, and incontinence without realization.  She also has some urgency as well.  She denies any gross hematuria.  She has a history of diverticulitis, most recent CT scan from January 2022 shows no urologic abnormalities.   PMH: Past Medical History:  Diagnosis Date   Diverticulitis    Hypertension     Surgical History: Past Surgical History:  Procedure Laterality Date   APPENDECTOMY     BREAST BIOPSY Left 06/21/2022   Stereo Bx, X clip, path pending    Family History: Family History  Problem Relation Age of Onset   Breast cancer Neg Hx     Social History:  reports that she has never smoked. She has never used smokeless tobacco. She reports that she does not currently use alcohol. She reports that she does not use drugs.  Physical Exam: BP (!) 173/83 (BP Location: Left Arm, Patient Position: Sitting, Cuff Size: Large)   Pulse 85   Ht 5' 2.5" (1.588 m)   Wt 189 lb 9.6 oz (86 kg)   BMI 34.13 kg/m    Constitutional:  Alert and oriented, No acute distress. Cardiovascular: No clubbing, cyanosis, or edema. Respiratory: Normal respiratory effort, no increased work of breathing. GI: Abdomen is soft, nontender, nondistended, no abdominal masses   Assessment & Plan:   67 year old female with a year of symptoms with vaginal dryness, reported recurrent UTIs despite negative cultures, and some incontinence without sensation as well as some overactive symptoms of urgency and frequency.  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  We discussed other potential pathology like colovesical fistula or overactive bladder that potentially could also  contribute to her symptoms.  She would like to start with a topical estrogen cream first, consider CT urogram and/or cystoscopy if no improvement  Trial of topical estrogen cream for genitourinary syndrome of menopause RTC 6 to 8 weeks symptom check Consider CT urogram/cystoscopy if persistent symptoms Consider OAB medication at follow-up if persistent urge/frequency May benefit from evaluation with Dr. Sherron Monday in the future   Legrand Rams, MD 08/06/2023  [facility] [address] [place], [place] [address] [phone]

## 2023-08-06 NOTE — Patient Instructions (Signed)
Genitourinary syndrome of menopause(GSM) can cause a number of symptoms including dryness, leakage, urinary symptoms, recurrent infections, and pelvic discomfort.  This is very common, and easily treated with topical estrogen cream.  This can take a few weeks to see a difference.  Would also recommend cranberry tablets to help prevent infections and potentially improve some of your urinary symptoms as well.

## 2023-08-15 DIAGNOSIS — L292 Pruritus vulvae: Secondary | ICD-10-CM | POA: Diagnosis not present

## 2023-08-15 DIAGNOSIS — N390 Urinary tract infection, site not specified: Secondary | ICD-10-CM | POA: Diagnosis not present

## 2023-08-15 DIAGNOSIS — I10 Essential (primary) hypertension: Secondary | ICD-10-CM | POA: Diagnosis not present

## 2023-08-15 DIAGNOSIS — H524 Presbyopia: Secondary | ICD-10-CM | POA: Diagnosis not present

## 2023-09-13 ENCOUNTER — Other Ambulatory Visit: Payer: Self-pay | Admitting: Urology

## 2023-09-13 DIAGNOSIS — R3129 Other microscopic hematuria: Secondary | ICD-10-CM

## 2023-09-13 NOTE — Progress Notes (Deleted)
09/16/2023 10:19 AM   Kayla Pope October 01, 1956 [id number]  Referring provider: [address] PA-C [address] [place],  [place] [address]  Urological history: 1. GSM -contributing factors of menopause,   Premature ovarian failure Decreased sexual frequency or abstinence Lack of a vaginal birth  2. Incontinence -contributing factors of GSM, prediabetes, HTN and high BMI  No chief complaint on file.  HPI: Kayla Pope is a 66 y.o. female who presents today for follow up.  Previous records reviewed.   She was seen on August 06, 2023 by Dr. Richardo Hanks for symptoms of vaginal dryness, incontinence, and urgency.  She was also having episodes of UTI symptoms with negative urine cultures.  She was started on vaginal estrogen cream.  UA ***  PVR ***  PMH: Past Medical History:  Diagnosis Date   Diverticulitis    Hypertension     Surgical History: Past Surgical History:  Procedure Laterality Date   APPENDECTOMY     BREAST BIOPSY Left 06/21/2022   Stereo Bx, X clip, path pending    Home Medications:  Allergies as of 09/16/2023       Reactions   Amoxicillin Nausea Only   Potassium Chloride    Other Reaction(s): Other, Other (See Comments) Other Reaction: Not Assessed   Sulfamethoxazole-trimethoprim Nausea Only   Penicillins Nausea Only, Rash   Other Reaction(s): Other (See Comments), Other (See Comments) Other Reaction: Not Assessed Stomach pain        Medication List        Accurate as of September 13, 2023 10:19 AM. If you have any questions, ask your nurse or doctor.          estradiol 0.1 MG/GM vaginal cream Commonly known as: ESTRACE Estrogen Cream Instruction Discard applicator Apply pea sized amount to tip of finger to urethra before bed. Wash hands well after application. Use every night for 1 week then use Monday, Wednesday and Friday.   montelukast 10 MG tablet Commonly known as: SINGULAIR Take 10 mg by mouth  daily.   Multi-Vitamin tablet Take 1 tablet by mouth daily.        Allergies:  Allergies  Allergen Reactions   Amoxicillin Nausea Only   Potassium Chloride     Other Reaction(s): Other, Other (See Comments)  Other Reaction: Not Assessed   Sulfamethoxazole-Trimethoprim Nausea Only   Penicillins Nausea Only and Rash    Other Reaction(s): Other (See Comments), Other (See Comments)  Other Reaction: Not Assessed  Stomach pain    Family History: Family History  Problem Relation Age of Onset   Breast cancer Neg Hx     Social History:  reports that she has never smoked. She has never used smokeless tobacco. She reports that she does not currently use alcohol. She reports that she does not use drugs.  ROS: Pertinent ROS in HPI  Physical Exam: There were no vitals taken for this visit.  Constitutional:  Well nourished. Alert and oriented, No acute distress. HEENT: [place] AT, moist mucus membranes.  Trachea midline, no masses. Cardiovascular: No clubbing, cyanosis, or edema. Respiratory: Normal respiratory effort, no increased work of breathing. GU: No CVA tenderness.  No bladder fullness or masses.  Recession of labia minora, dry, pale vulvar vaginal mucosa and loss of mucosal ridges and folds.  Normal urethral meatus, no lesions, no prolapse, no discharge.   No urethral masses, tenderness and/or tenderness. No bladder fullness, tenderness or masses. *** vagina mucosa, *** estrogen effect, no discharge,  no lesions, *** pelvic support, *** cystocele and *** rectocele noted.  No cervical motion tenderness.  Uterus is freely mobile and non-fixed.  No adnexal/parametria masses or tenderness noted.  Anus and perineum are without rashes or lesions.   ***  Neurologic: Grossly intact, no focal deficits, moving all 4 extremities. Psychiatric: Normal mood and affect.    Laboratory Data: CBC w/auto Differential (5 Part) Order: [id number] Component Ref Range & Units 1 mo ago  WBC (White  Blood Cell Count) 4.1 - 10.2 10^3/uL 10.0  RBC (Red Blood Cell Count) 4.04 - 5.48 10^6/uL 4.29  Hemoglobin 12.0 - 15.0 gm/dL 54.6  Hematocrit 27.0 - 47.0 % 39.0  MCV (Mean Corpuscular Volume) 80.0 - 100.0 fl 90.9  MCH (Mean Corpuscular Hemoglobin) 27.0 - 31.2 pg 29.8  MCHC (Mean Corpuscular Hemoglobin Concentration) 32.0 - 36.0 gm/dL 35.0  Platelet Count 093 - 450 10^3/uL 224  RDW-CV (Red Cell Distribution Width) 11.6 - 14.8 % 13.1  MPV (Mean Platelet Volume) 9.4 - 12.4 fl 10.1  Neutrophils 1.50 - 7.80 10^3/uL 3.53  Lymphocytes 1.00 - 3.60 10^3/uL 5.75 High   Comment: Smear review agrees with analyzer results  Monocytes 0.00 - 1.50 10^3/uL 0.51  Eosinophils 0.00 - 0.55 10^3/uL 0.10  Basophils 0.00 - 0.09 10^3/uL 0.09  Neutrophil % 32.0 - 70.0 % 35.3  Lymphocyte % 10.0 - 50.0 % 57.5 High   Monocyte % 4.0 - 13.0 % 5.1  Eosinophil % 1.0 - 5.0 % 1.0  Basophil% 0.0 - 2.0 % 0.9  Immature Granulocyte % <=0.7 % 0.2  Immature Granulocyte Count <=0.06 10^3/L 0.02  Resulting Agency [facility]   Specimen Collected: 07/23/23 12:39   Performed by: [facility] Last Resulted: 07/23/23 17:01  Received From: [facility]  Result Received: 07/25/23 11:06    Comprehensive Metabolic Panel (CMP) Order: [id number] Component Ref Range & Units 1 mo ago  Glucose 70 - 110 mg/dL 88  Sodium 696 - 789 mmol/L 139  Potassium 3.6 - 5.1 mmol/L 4.2  Chloride 97 - 109 mmol/L 105  Carbon Dioxide (CO2) 22.0 - 32.0 mmol/L 29.1  Urea Nitrogen (BUN) 7 - 25 mg/dL 13  Creatinine 0.6 - 1.1 mg/dL 0.7  Glomerular Filtration Rate (eGFR) >60 mL/min/1.73sq m 95  Comment: CKD-EPI (2021) does not include patient's race in the calculation of eGFR.  Monitoring changes of plasma creatinine and eGFR over time is useful for monitoring kidney function.  Interpretive Ranges for eGFR (CKD-EPI 2021):  eGFR:       >60 mL/min/1.73 sq. m - Normal eGFR:        30-59 mL/min/1.73 sq. m - Moderately Decreased eGFR:       15-29 mL/min/1.73 sq. m  - Severely Decreased eGFR:       < 15 mL/min/1.73 sq. m  - Kidney Failure   Note: These eGFR calculations do not apply in acute situations when eGFR is changing rapidly or patients on dialysis.  Calcium 8.7 - 10.3 mg/dL 9.5  AST 8 - 39 U/L 20  ALT 5 - 38 U/L 25  Alk Phos (alkaline Phosphatase) 34 - 104 U/L 86  Albumin 3.5 - 4.8 g/dL 4.4  Bilirubin, Total 0.3 - 1.2 mg/dL 0.5  Protein, Total 6.1 - 7.9 g/dL 7.4  A/G Ratio 1.0 - 5.0 gm/dL 1.5  Resulting Agency [facility]   Specimen Collected: 07/23/23 12:39   Performed by: [facility] Last Resulted: 07/23/23 15:28  Received From: [facility]  Result Received: 07/25/23 11:06   Hemoglobin A1C Order: [id number] Component Ref Range & Units 4 mo ago  Hemoglobin A1C 4.2 - 5.6 % 5.5  Average Blood Glucose (Calc) mg/dL 782  Resulting Agency [facility]  Narrative Performed by [facility] Normal Range:    4.2 - 5.6% Increased Risk:  5.7 - 6.4% Diabetes:        >= 6.5% Glycemic Control for adults with diabetes:  <7%    Specimen Collected: 05/06/23 09:24   Performed by: [facility] Last Resulted: 05/06/23 10:46  Received From: [facility]  Result Received: 07/23/23 12:58  Urinalysis See HPI and EPIC I have reviewed the labs.   Pertinent Imaging: ***   Assessment & Plan:  ***  1. GSM -***  2. Incontinence -***   3. Microscopic hematuria -UA w/ micro heme w/ UTI in 06/2023 -UA ***  No follow-ups on file.  These notes generated with voice recognition software. I apologize for typographical errors.  Cloretta Ned  [facility] [address]  [address] [place], [place] [address] [phone]

## 2023-09-16 ENCOUNTER — Ambulatory Visit: Payer: Medicare HMO | Admitting: Urology

## 2023-09-16 DIAGNOSIS — R3129 Other microscopic hematuria: Secondary | ICD-10-CM

## 2023-09-16 DIAGNOSIS — N952 Postmenopausal atrophic vaginitis: Secondary | ICD-10-CM

## 2023-09-16 DIAGNOSIS — R32 Unspecified urinary incontinence: Secondary | ICD-10-CM

## 2023-11-05 IMAGING — MG MM DIGITAL SCREENING BILAT W/ TOMO AND CAD
8 series · 8 of 24 positions shown · non-contrast
Comparison: Previous exam(s).

CLINICAL DATA: Screening.

EXAM:
DIGITAL SCREENING BILATERAL MAMMOGRAM WITH TOMOSYNTHESIS AND CAD
TECHNIQUE: Bilateral screening digital craniocaudal and mediolateral oblique
mammograms were obtained. Bilateral screening digital breast
tomosynthesis was performed. The images were evaluated with
computer-aided detection.

[R CC synth-2D]
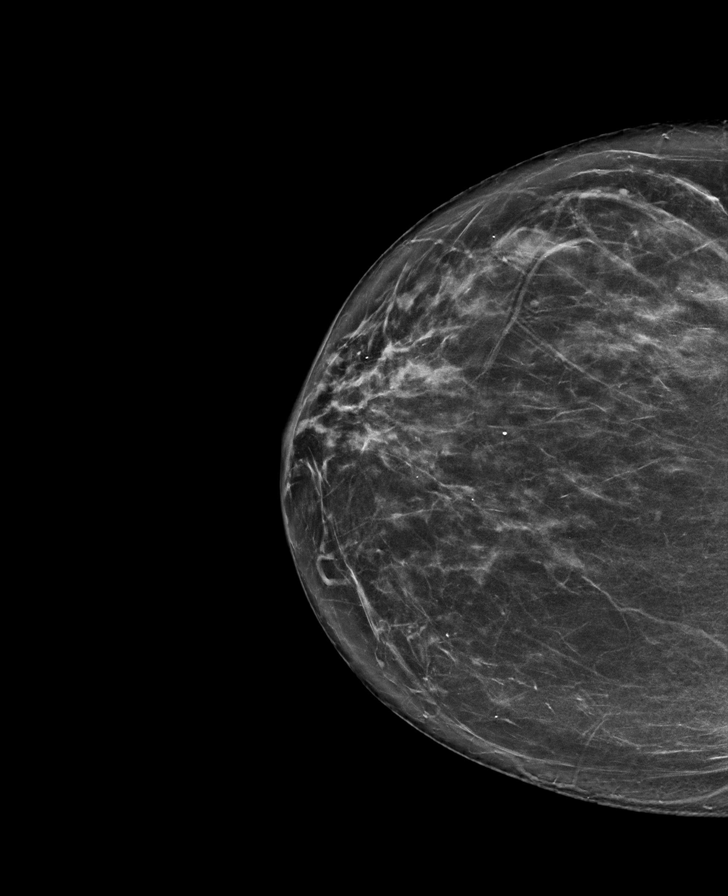

[R MLO synth-2D]
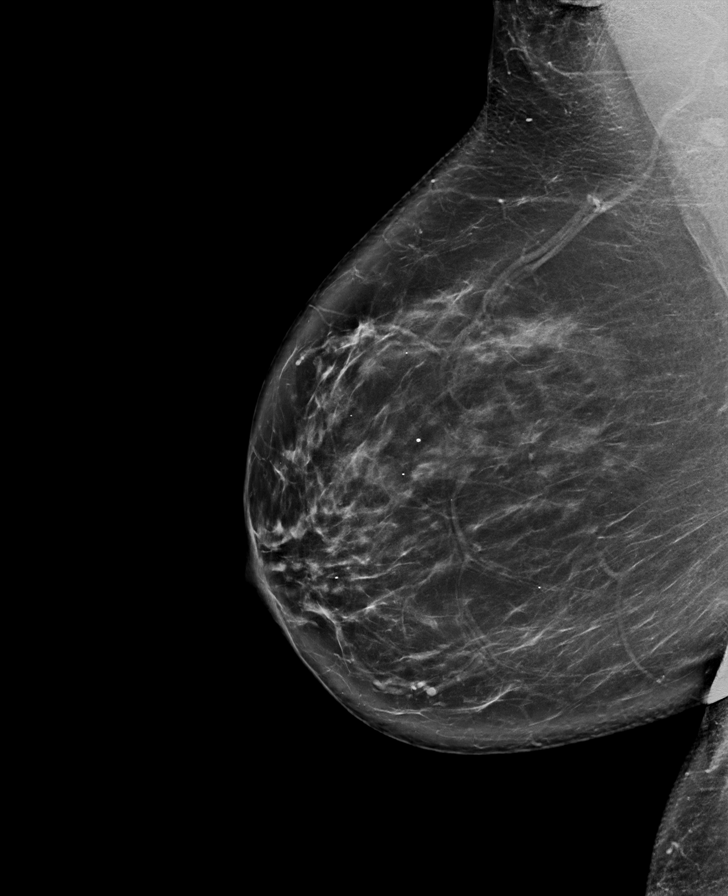

[L CC synth-2D]
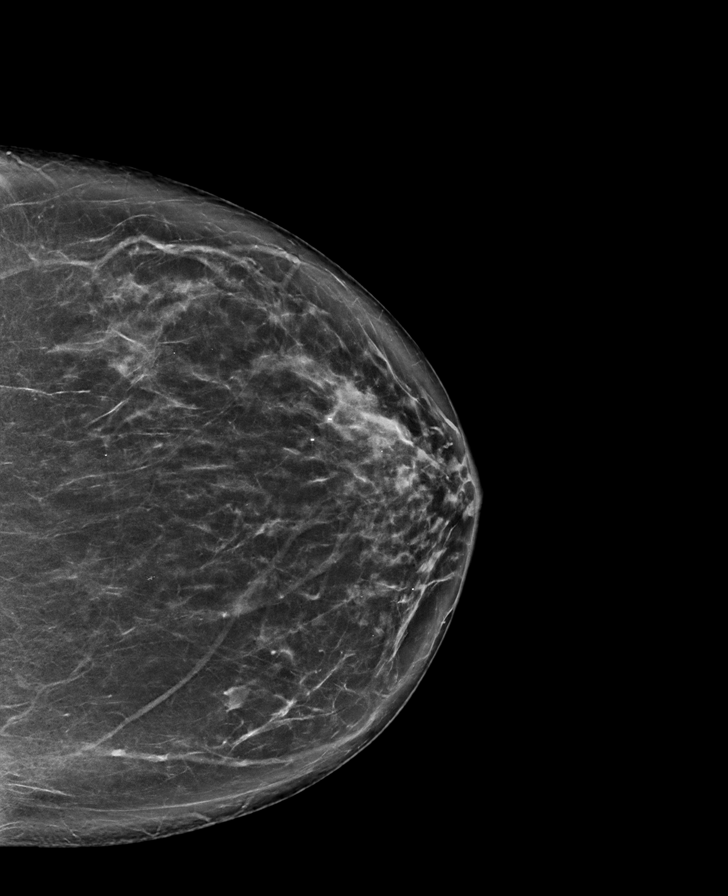

[L MLO synth-2D]
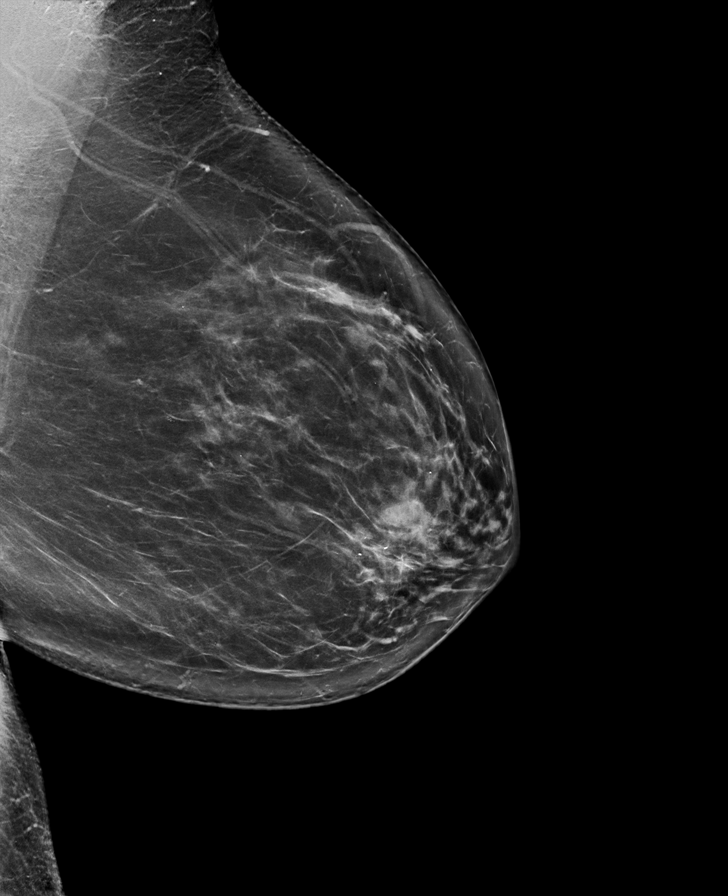

[R CC tomo · tomo slice 37/74.0]
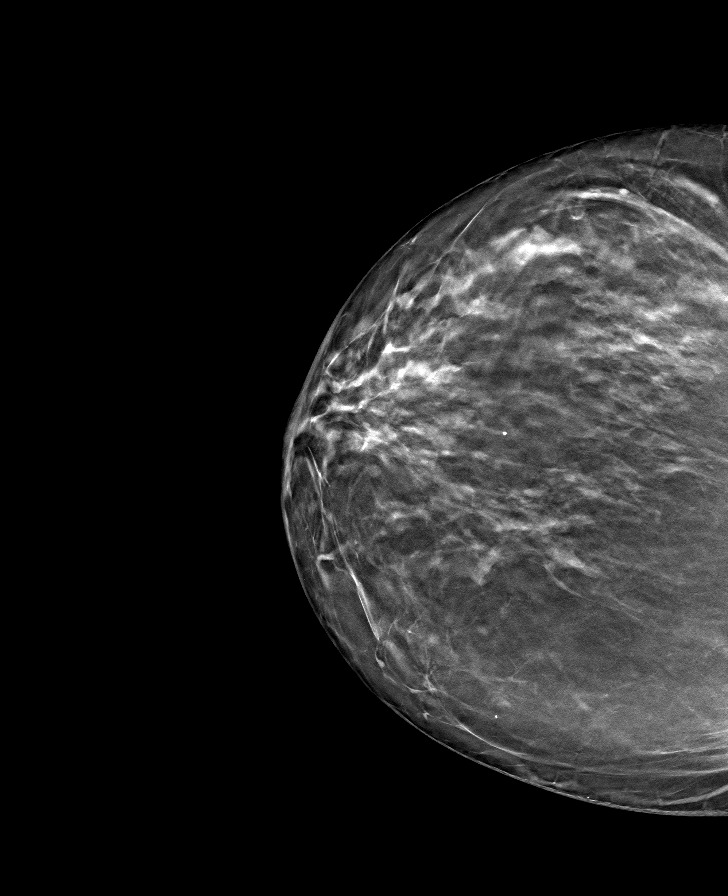

[L CC tomo · tomo slice 42/83.0]
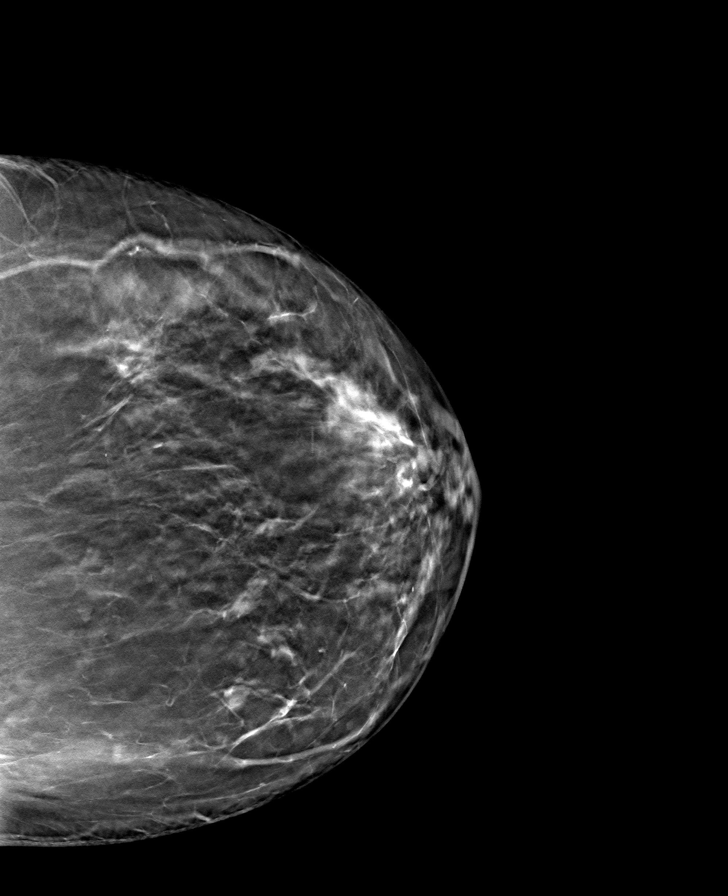

[L MLO tomo · tomo slice 45/89.0]
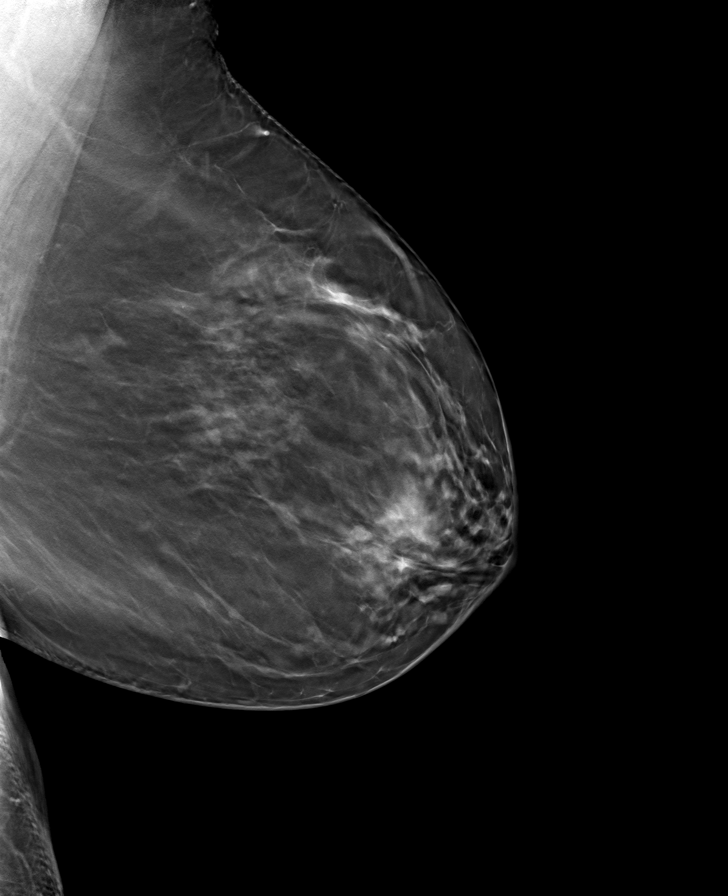

[R MLO tomo · tomo slice 47/93.0]
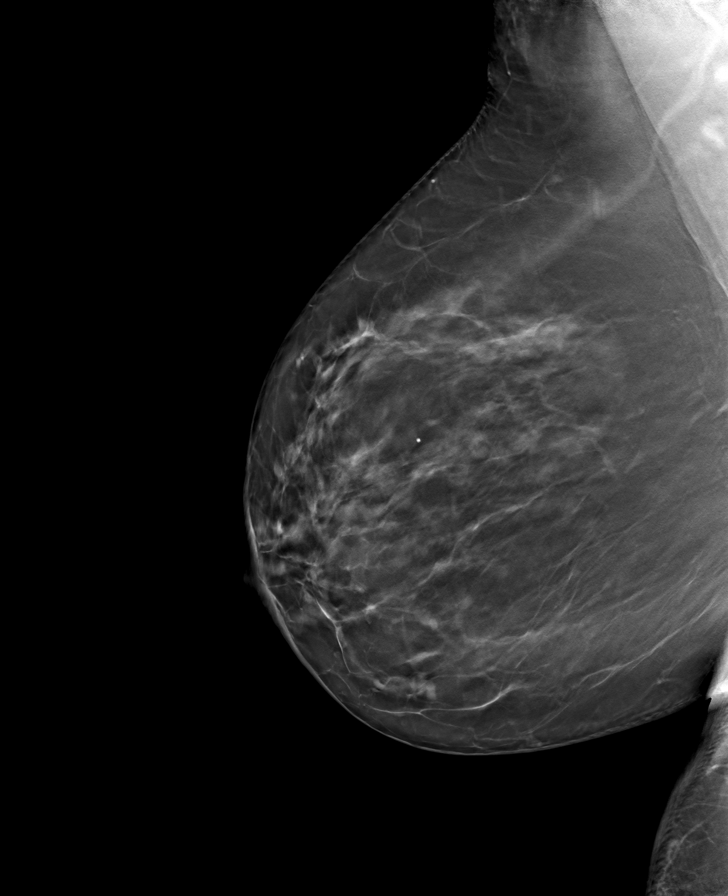

[8 of 24 positions shown; findings below may reference images not displayed]

ACR Breast Density Category b: There are scattered areas of
fibroglandular density.
FINDINGS: In the left breast, calcifications warrant further evaluation. In
the right breast, no findings suspicious for malignancy.
IMPRESSION: Further evaluation is suggested for calcifications in the left
breast.

RECOMMENDATION:
Diagnostic mammogram of the left breast. (Code:EV-[id number])

The patient will be contacted regarding the findings, and additional
imaging will be scheduled.

BI-RADS CATEGORY  0: Incomplete. Need additional imaging evaluation
and/or prior mammograms for comparison.

## 2023-12-05 DIAGNOSIS — R110 Nausea: Secondary | ICD-10-CM | POA: Diagnosis not present

## 2023-12-05 DIAGNOSIS — R194 Change in bowel habit: Secondary | ICD-10-CM | POA: Diagnosis not present

## 2023-12-05 DIAGNOSIS — R1013 Epigastric pain: Secondary | ICD-10-CM | POA: Diagnosis not present

## 2023-12-23 ENCOUNTER — Encounter: Payer: Self-pay | Admitting: Emergency Medicine

## 2023-12-23 ENCOUNTER — Ambulatory Visit
Admission: EM | Admit: 2023-12-23 | Discharge: 2023-12-23 | Disposition: A | Discharge status: Home / Self Care | Payer: PRIVATE HEALTH INSURANCE | Attending: Emergency Medicine | Admitting: Emergency Medicine

## 2023-12-23 DIAGNOSIS — I10 Essential (primary) hypertension: Secondary | ICD-10-CM

## 2023-12-23 DIAGNOSIS — R1032 Left lower quadrant pain: Secondary | ICD-10-CM

## 2023-12-23 DIAGNOSIS — R509 Fever, unspecified: Secondary | ICD-10-CM

## 2023-12-23 DIAGNOSIS — K5732 Diverticulitis of large intestine without perforation or abscess without bleeding: Secondary | ICD-10-CM | POA: Diagnosis not present

## 2023-12-23 DIAGNOSIS — Z880 Allergy status to penicillin: Secondary | ICD-10-CM | POA: Diagnosis not present

## 2023-12-23 DIAGNOSIS — K5792 Diverticulitis of intestine, part unspecified, without perforation or abscess without bleeding: Secondary | ICD-10-CM | POA: Diagnosis not present

## 2023-12-23 MED ORDER — ACETAMINOPHEN 325 MG PO TABS
975.0000 mg | ORAL_TABLET | Freq: Once | ORAL | Status: AC
Start: 2023-12-23 — End: 2023-12-23
  Administered 2023-12-23: 975 mg via ORAL

## 2023-12-23 NOTE — ED Notes (Signed)
Patient is being discharged from the Urgent Care and sent to the Emergency Department via pov . Per Chaney Malling, patient is in need of higher level of care due to abdominal pain. Patient is aware and verbalizes understanding of plan of care.  Vitals:   12/23/23 1556 12/23/23 1557  BP: (!) 178/90   Pulse:    Resp:    Temp:  98.5 F (36.9 C)  SpO2:

## 2023-12-23 NOTE — ED Provider Notes (Signed)
HPI  SUBJECTIVE:  Kayla Pope is a 68 y.o. female who presents with low abdominal cramping starting last night lasted for hours.  States that the pain has now spread over her entire abdomen.  It lasts hours, and becomes sharp when she stands up straight.  States that car ride over here was painful.  She states the pain goes to her back.  No nausea, vomiting, abdominal distention, anorexia.  No chest pain, pressure, heaviness, shortness of breath, coughing, wheezing.  She reports urinary urgency, frequency.  No dysuria, cloudy or odorous urine, hematuria, vaginal odor, bleeding, discharge.  She had a normal colored bowel movement this morning.  The car ride over here was painful.  She has never had symptoms like this before.  She tried probiotics, eating small amounts at a time.  Probiotics, stooling and sitting still help.  Symptoms are worse with movement/standing up.  Patient has a past medical history of recurrent UTIs, hypertension, leukemia in remission since 2018, diverticulitis.  She is status post appendectomy.  No history of pancreatitis, excess NSAID or alcohol use, gallbladder disease, peptic ulcer disease, atrial fibrillation, mesenteric ischemia, small bowel obstruction, diabetes, gastroparesis, aortic abdominal aneurysm, pyelonephritis, nephrolithiasis.  Family history negative for aortic abdominal aneurysm.  Saw her PCP on 1/9 for fatigue, dizziness, ineffective bowel movement her blood pressure on that visit was 140/70.  CBC, lipase, CMP and UA sent.  Testing significant for microscopic hematuria.  She was started on omeprazole.  Past Medical History:  Diagnosis Date   Diverticulitis    Hypertension     Past Surgical History:  Procedure Laterality Date   APPENDECTOMY     BREAST BIOPSY Left 06/21/2022   Stereo Bx, X clip, path pending    Family History  Problem Relation Age of Onset   Breast cancer Neg Hx     Social History   Tobacco Use   Smoking status: Never    Smokeless tobacco: Never  Vaping Use   Vaping status: Never Used  Substance Use Topics   Alcohol use: Not Currently   Drug use: Never    No current facility-administered medications for this encounter.  Current Outpatient Medications:    estradiol (ESTRACE) 0.1 MG/GM vaginal cream, Estrogen Cream Instruction Discard applicator Apply pea sized amount to tip of finger to urethra before bed. Wash hands well after application. Use every night for 1 week then use Monday, Wednesday and Friday., Disp: 42.5 g, Rfl: 12   lisinopril (ZESTRIL) 2.5 MG tablet, Take by mouth., Disp: , Rfl:    Multiple Vitamin (MULTI-VITAMIN) tablet, Take 1 tablet by mouth daily., Disp: , Rfl:   Allergies  Allergen Reactions   Amoxicillin Nausea Only   Potassium Chloride     Other Reaction(s): Other, Other (See Comments)  Other Reaction: Not Assessed   Sulfamethoxazole-Trimethoprim Nausea Only   Penicillins Nausea Only and Rash    Other Reaction(s): Other (See Comments), Other (See Comments)  Other Reaction: Not Assessed  Stomach pain     ROS  As noted in HPI.   Physical Exam  BP (!) 178/90 (BP Location: Left Arm)   Pulse (!) 113   Temp 98.5 F (36.9 C) (Oral)   Resp 18   SpO2 98%   Constitutional: Well developed, well nourished, appears uncomfortable. Eyes: PERRL, EOMI, conjunctiva normal bilaterally HENT: Normocephalic, atraumatic,mucus membranes moist Respiratory: Normal inspiratory effort Cardiovascular: Regular tachycardia, no murmurs, no gallops, no rubs GI: Soft, nondistended, normal bowel sounds, exquisite left lower quadrant tenderness with voluntary guarding,  no rebound.  No flank tenderness.  No other abdominal tenderness.  No pulsatile palpable abdominal mass.  Negative Murphy. Back: no CVAT skin: No rash, skin intact Musculoskeletal: No edema, no tenderness, no deformities Neurologic: Alert & oriented x 3, CN III-XII  intact, no motor deficits, sensation grossly intact.  Speech  fluent. Psychiatric: Speech and behavior appropriate   ED Course   Medications  acetaminophen (TYLENOL) tablet 975 mg (975 mg Oral Given 12/23/23 1506)    No orders of the defined types were placed in this encounter.  No results found for this or any previous visit (from the past 24 hours). No results found.  ED Clinical Impression  1. Left lower quadrant abdominal pain   2. Fever, unspecified fever cause   3. Elevated blood pressure reading in office with diagnosis of hypertension      ED Assessment/Plan     Patient presents with acute illness with systemic symptoms of tachycardia and fever.  Outside records reviewed.  Additional medical history obtained.  Gave patient 925 mg of Tylenol for fever.  1.  Diffuse abdominal pain.  Patient has peritoneal signs of voluntary guarding, and has symptoms of pain with standing up straight and with going over bumps while driving.  She has a low-grade fever and is tachycardic here.  I am primarily concerned about complicated diverticulitis versus diverticulitis with perforation.  Also in the differential is obstruction, infected nephrolithiasis, UTI/pyelonephritis.  Cholecystitis, hepatitis, pancreatitis, referred peptic ulcer, leaking aortic abdominal aneurysm is less likely.  Patient declined nausea and pain medication here.  I am sending her to the emergency department for advanced imaging, pain management, fluids and further workup.  I believe she is stable to go via private vehicle.  Offered to send via EMS, she states that her friend will take her.  2.  Elevated blood-pressure reading without diagnosis of hypertension.  Patient has not taken her medications in 1 to 2 days, and she appears to be uncomfortable.  She has a mild headache, but she is completely neurologically intact, denies chest pain, shortness of breath, other signs or symptoms of hypertensive emergency.  Her blood pressure was trending down prior to discharge.  Discussed  rationale for transfer to the emergency department with patient.  She agrees with plan.  Meds ordered this encounter  Medications   acetaminophen (TYLENOL) tablet 975 mg      *This clinic note was created using Scientist, clinical (histocompatibility and immunogenetics). Therefore, there may be occasional mistakes despite careful proofreading. ?    Domenick Gong, MD 12/23/23 1606

## 2023-12-23 NOTE — ED Triage Notes (Signed)
Patient presets with abdominal discomfort off and on since December but is having increased sharp pain that radiates around to her back. Also, patient states she has HTN and has not BP meds in the last two days.

## 2023-12-23 NOTE — Discharge Instructions (Signed)
I have given you 975 mg of aspirin here.  Your initial vitals were temperature 100.3, heart rate of 113.  You did not have a fever when you left here.  Your blood pressure has come down.  I am concerned that you have a perforated diverticulitis, or complicated diverticulitis.  Please go to the emergency department right now to rule this other entities out.  Let them know if your pain changes, gets worse.  Do not have anything to eat or drink until your ER evaluation is complete.

## 2023-12-24 DIAGNOSIS — K5732 Diverticulitis of large intestine without perforation or abscess without bleeding: Secondary | ICD-10-CM | POA: Diagnosis not present

## 2023-12-27 DIAGNOSIS — K5792 Diverticulitis of intestine, part unspecified, without perforation or abscess without bleeding: Secondary | ICD-10-CM | POA: Diagnosis not present

## 2023-12-27 DIAGNOSIS — C91Z0 Other lymphoid leukemia not having achieved remission: Secondary | ICD-10-CM | POA: Diagnosis not present

## 2023-12-27 DIAGNOSIS — R5383 Other fatigue: Secondary | ICD-10-CM | POA: Diagnosis not present

## 2023-12-27 DIAGNOSIS — I10 Essential (primary) hypertension: Secondary | ICD-10-CM | POA: Diagnosis not present

## 2023-12-27 DIAGNOSIS — R509 Fever, unspecified: Secondary | ICD-10-CM | POA: Diagnosis not present

## 2023-12-27 DIAGNOSIS — R7303 Prediabetes: Secondary | ICD-10-CM | POA: Diagnosis not present

## 2024-01-03 ENCOUNTER — Ambulatory Visit
Admission: EM | Admit: 2024-01-03 | Discharge: 2024-01-03 | Disposition: A | Discharge status: Home / Self Care | Payer: PRIVATE HEALTH INSURANCE | Attending: Emergency Medicine | Admitting: Emergency Medicine

## 2024-01-03 ENCOUNTER — Encounter: Payer: Self-pay | Admitting: Emergency Medicine

## 2024-01-03 DIAGNOSIS — R809 Proteinuria, unspecified: Secondary | ICD-10-CM | POA: Insufficient documentation

## 2024-01-03 DIAGNOSIS — R319 Hematuria, unspecified: Secondary | ICD-10-CM | POA: Diagnosis not present

## 2024-01-03 DIAGNOSIS — N390 Urinary tract infection, site not specified: Secondary | ICD-10-CM | POA: Insufficient documentation

## 2024-01-03 LAB — URINALYSIS, W/ REFLEX TO CULTURE (INFECTION SUSPECTED)
Bilirubin Urine: NEGATIVE
Glucose, UA: NEGATIVE mg/dL
Ketones, ur: NEGATIVE mg/dL
Nitrite: NEGATIVE
Protein, ur: 100 mg/dL — AB
RBC / HPF: >50 RBC/hpf (ref 0–5)
Specific Gravity, Urine: 1.02 (ref 1.005–1.030)
pH: 7 (ref 5.0–8.0)

## 2024-01-03 LAB — WET PREP, GENITAL
Clue Cells Wet Prep HPF POC: NONE SEEN
Sperm: NONE SEEN
Trich, Wet Prep: NONE SEEN
WBC, Wet Prep HPF POC: <10 (ref <10)
Yeast Wet Prep HPF POC: NONE SEEN

## 2024-01-03 MED ORDER — LEVOFLOXACIN 250 MG PO TABS: 250.0000 mg | ORAL_TABLET | Freq: Every day | ORAL | 0 refills | Status: AC

## 2024-01-03 MED ORDER — PHENAZOPYRIDINE HCL 200 MG PO TABS: 200.0000 mg | ORAL_TABLET | Freq: Three times a day (TID) | ORAL | 0 refills | Status: DC

## 2024-01-03 NOTE — ED Provider Notes (Signed)
 [facility]    CSN: [id number] Arrival date & time: 01/03/24  0856      History   Chief Complaint Chief Complaint  Patient presents with   Dysuria    HPI Kayla Pope is a 68 y.o. female.   68 year old female, Kayla Pope, presents to urgent care for evaluation of urinary pressure and urgency that started this morning.  Patient states she took a Azo last night.  Past medical history: Diverticulitis, hypertension, hematuria  The history is provided by the patient. No language interpreter was used.    Past Medical History:  Diagnosis Date   Diverticulitis    Hypertension     Patient Active Problem List   Diagnosis Date Noted   Acute UTI 01/03/2024   Hematuria 01/03/2024   Proteinuria 01/03/2024    Past Surgical History:  Procedure Laterality Date   APPENDECTOMY     BREAST BIOPSY Left 06/21/2022   Stereo Bx, X clip, path pending    OB History   No obstetric history on file.      Home Medications    Prior to Admission medications   Medication Sig Start Date End Date Taking? Authorizing Provider  levofloxacin  (LEVAQUIN ) 250 MG tablet Take 1 tablet (250 mg total) by mouth daily for 3 days. 01/03/24 01/06/24 Yes Dyquan Minks, Rilla, NP  phenazopyridine  (PYRIDIUM ) 200 MG tablet Take 1 tablet (200 mg total) by mouth 3 (three) times daily. 01/03/24  Yes Keaden Gunnoe, NP  estradiol  (ESTRACE ) 0.1 MG/GM vaginal cream Estrogen Cream Instruction Discard applicator Apply pea sized amount to tip of finger to urethra before bed. Wash hands well after application. Use every night for 1 week then use Monday, Wednesday and Friday. 08/06/23   Francisca Redell BROCKS, MD  lisinopril (ZESTRIL) 2.5 MG tablet Take by mouth. 11/01/23 10/31/24  [provider]  Multiple Vitamin (MULTI-VITAMIN) tablet Take 1 tablet by mouth daily.    [provider]    Family History Family History  Problem Relation Age of Onset   Breast cancer Neg Hx     Social  History Social History   Tobacco Use   Smoking status: Never   Smokeless tobacco: Never  Vaping Use   Vaping status: Never Used  Substance Use Topics   Alcohol use: Not Currently   Drug use: Never     Allergies   Amoxicillin, Potassium chloride, Sulfamethoxazole-trimethoprim, and Penicillins   Review of Systems Review of Systems  Constitutional:  Negative for fever.  Gastrointestinal:  Positive for abdominal pain. Negative for diarrhea, nausea and vomiting.  Genitourinary:  Positive for dysuria.  All other systems reviewed and are negative.    Physical Exam Triage Vital Signs ED Triage Vitals  Encounter Vitals Group     BP 01/03/24 1020 (!) 163/89     Systolic BP Percentile --      Diastolic BP Percentile --      Pulse Rate 01/03/24 1020 87     Resp 01/03/24 1020 14     Temp 01/03/24 1020 98.7 F (37.1 C)     Temp Source 01/03/24 1020 Oral     SpO2 01/03/24 1020 99 %     Weight 01/03/24 1017 184 lb (83.5 kg)     Height 01/03/24 1017 5' 2.5 (1.588 m)     Head Circumference --      Peak Flow --      Pain Score 01/03/24 1017 9     Pain Loc --  Pain Education --      Exclude from Growth Chart --    No data found.  Updated Vital Signs BP (!) 163/89 (BP Location: Left Arm)   Pulse 87   Temp 98.7 F (37.1 C) (Oral)   Resp 14   Ht 5' 2.5 (1.588 m)   Wt 184 lb (83.5 kg)   SpO2 99%   BMI 33.12 kg/m   Visual Acuity Right Eye Distance:   Left Eye Distance:   Bilateral Distance:    Right Eye Near:   Left Eye Near:    Bilateral Near:     Physical Exam Vitals and nursing note reviewed.  Constitutional:      General: She is not in acute distress.    Appearance: She is well-developed and well-groomed.  HENT:     Head: Normocephalic and atraumatic.  Eyes:     Conjunctiva/sclera: Conjunctivae normal.  Cardiovascular:     Rate and Rhythm: Normal rate.     Heart sounds: No murmur heard. Pulmonary:     Effort: Pulmonary effort is normal. No  respiratory distress.  Abdominal:     Tenderness: There is abdominal tenderness in the suprapubic area.  Musculoskeletal:        General: No swelling.     Cervical back: Neck supple.  Skin:    General: Skin is warm and dry.     Capillary Refill: Capillary refill takes less than 2 seconds.  Neurological:     General: No focal deficit present.     Mental Status: She is alert and oriented to person, place, and time.     GCS: GCS eye subscore is 4. GCS verbal subscore is 5. GCS motor subscore is 6.     Cranial Nerves: No cranial nerve deficit.     Sensory: No sensory deficit.  Psychiatric:        Attention and Perception: Attention normal.        Mood and Affect: Mood normal.        Speech: Speech normal.        Behavior: Behavior normal. Behavior is cooperative.      UC Treatments / Results  Labs (all labs ordered are listed, but only abnormal results are displayed) Labs Reviewed  URINALYSIS, W/ REFLEX TO CULTURE (INFECTION SUSPECTED) - Abnormal; Notable for the following components:      Result Value   APPearance CLOUDY (*)    Hgb urine dipstick LARGE (*)    Protein, ur 100 (*)    Leukocytes,Ua LARGE (*)    Bacteria, UA FEW (*)    All other components within normal limits  WET PREP, GENITAL    EKG   Radiology No results found.  Procedures Procedures (including critical care time)  Medications Ordered in UC Medications - No data to display  Initial Impression / Assessment and Plan / UC Course  I have reviewed the triage vital signs and the nursing notes.  Pertinent labs & imaging results that were available during my care of the patient were reviewed by me and considered in my medical decision making (see chart for details).    Discussed exam findings and plan of care with patient, scripted Levaquin  /Pyridium  due to allergies, go to ER precautions given.  Patient verbalized understanding this provider  Ddx: Acute UTI, hematuria, proteinuria Final Clinical  Impressions(s) / UC Diagnoses   Final diagnoses:  Acute UTI  Hematuria, unspecified type  Proteinuria, unspecified type     Discharge Instructions  You were prescribed Levaquin  for UTI, please drink plenty of water.  I have also scripted pyridium , will turn urine orange. If you develop fever, nausea,vomiting, unable to keep meds down, go to ER for further evaluation. Follow up with PCP next week, if symptoms persist or you have recurrent UTI's you will need to follow up with urologist,PCP can refer.  WE have sent a urine culture,if antibiotic needs to be switched due to culture results you will be notified.      ED Prescriptions     Medication Sig Dispense Auth. Provider   levofloxacin  (LEVAQUIN ) 250 MG tablet Take 1 tablet (250 mg total) by mouth daily for 3 days. 3 tablet Evynn Boutelle, NP   phenazopyridine  (PYRIDIUM ) 200 MG tablet Take 1 tablet (200 mg total) by mouth 3 (three) times daily. 6 tablet Ellicia Alix, Rilla, NP      PDMP not reviewed this encounter.   Aminta Rilla, NP 01/03/24 2024

## 2024-01-03 NOTE — ED Triage Notes (Signed)
 Patient reports lower abdominal pain last night.  Patient reports urinary pressure and urinary urgency that started this morning.  Patient took AZO today.

## 2024-01-03 NOTE — Discharge Instructions (Signed)
 You were prescribed Levaquin  for UTI, please drink plenty of water.  I have also scripted pyridium , will turn urine orange. If you develop fever, nausea,vomiting, unable to keep meds down, go to ER for further evaluation. Follow up with PCP next week, if symptoms persist or you have recurrent UTI's you will need to follow up with urologist,PCP can refer.  WE have sent a urine culture,if antibiotic needs to be switched due to culture results you will be notified.

## 2024-01-29 DIAGNOSIS — K5792 Diverticulitis of intestine, part unspecified, without perforation or abscess without bleeding: Secondary | ICD-10-CM | POA: Diagnosis not present

## 2024-01-29 DIAGNOSIS — I10 Essential (primary) hypertension: Secondary | ICD-10-CM | POA: Diagnosis not present

## 2024-01-29 DIAGNOSIS — N390 Urinary tract infection, site not specified: Secondary | ICD-10-CM | POA: Diagnosis not present

## 2024-02-05 DIAGNOSIS — E669 Obesity, unspecified: Secondary | ICD-10-CM | POA: Diagnosis not present

## 2024-02-05 DIAGNOSIS — I10 Essential (primary) hypertension: Secondary | ICD-10-CM | POA: Diagnosis not present

## 2024-02-05 DIAGNOSIS — G8929 Other chronic pain: Secondary | ICD-10-CM | POA: Diagnosis not present

## 2024-02-05 DIAGNOSIS — J309 Allergic rhinitis, unspecified: Secondary | ICD-10-CM | POA: Diagnosis not present

## 2024-02-05 DIAGNOSIS — K5790 Diverticulosis of intestine, part unspecified, without perforation or abscess without bleeding: Secondary | ICD-10-CM | POA: Diagnosis not present

## 2024-02-05 DIAGNOSIS — R32 Unspecified urinary incontinence: Secondary | ICD-10-CM | POA: Diagnosis not present

## 2024-02-05 DIAGNOSIS — C9511 Chronic leukemia of unspecified cell type, in remission: Secondary | ICD-10-CM | POA: Diagnosis not present

## 2024-02-05 DIAGNOSIS — L299 Pruritus, unspecified: Secondary | ICD-10-CM | POA: Diagnosis not present

## 2024-02-12 ENCOUNTER — Ambulatory Visit: Payer: PRIVATE HEALTH INSURANCE | Admitting: Physician Assistant

## 2024-02-26 ENCOUNTER — Encounter: Payer: Self-pay | Admitting: Physician Assistant

## 2024-02-26 ENCOUNTER — Ambulatory Visit: Payer: PRIVATE HEALTH INSURANCE | Admitting: Physician Assistant

## 2024-02-26 VITALS — BP 166/97 | HR 92 | Ht 62.5 in | Wt 187.0 lb

## 2024-02-26 DIAGNOSIS — N958 Other specified menopausal and perimenopausal disorders: Secondary | ICD-10-CM

## 2024-02-26 DIAGNOSIS — R3129 Other microscopic hematuria: Secondary | ICD-10-CM | POA: Diagnosis not present

## 2024-02-26 DIAGNOSIS — N390 Urinary tract infection, site not specified: Secondary | ICD-10-CM | POA: Diagnosis not present

## 2024-02-26 LAB — MICROSCOPIC EXAMINATION

## 2024-02-26 LAB — URINALYSIS, COMPLETE
Bilirubin, UA: NEGATIVE
Glucose, UA: NEGATIVE
Ketones, UA: NEGATIVE
Leukocytes,UA: NEGATIVE
Nitrite, UA: NEGATIVE
Specific Gravity, UA: 1.025 (ref 1.005–1.030)
Urobilinogen, Ur: 0.2 mg/dL (ref 0.2–1.0)
pH, UA: 6 (ref 5.0–7.5)

## 2024-02-26 LAB — BLADDER SCAN AMB NON-IMAGING

## 2024-02-26 MED ORDER — DIAZEPAM 2 MG PO TABS: ORAL_TABLET | ORAL | 0 refills | Status: AC

## 2024-02-26 MED ORDER — ESTRADIOL 0.1 MG/GM VA CREA: TOPICAL_CREAM | VAGINAL | 12 refills | Status: AC

## 2024-02-26 NOTE — Patient Instructions (Signed)
 Start topical vaginal estrogen cream. Apply a pea-sized amount at the opening of the urethra every day for 2 weeks, then every Monday, Wednesday, and Friday forever. On days you are not using estrogen cream, you may try the nonhormonal vaginal moisturizer called Replens to help with your symptoms. The imaging department will call you to schedule your CT scan. We will see you back in clinic with Dr. Richardo Hanks for a cystoscopy and to review your CT results to evaluate you for sources of blood in the urine. I'll see you back in clinic in 3 months to check your progress.  Cystoscopy Cystoscopy is a procedure that is used to help diagnose and sometimes treat conditions that affect the lower urinary tract. The lower urinary tract includes the bladder and the urethra. The urethra is the tube that drains urine from the bladder. Cystoscopy is done using a thin, tube-shaped instrument with a light and camera at the end (cystoscope). The cystoscope may be hard or flexible, depending on the goal of the procedure. The cystoscope is inserted through the urethra, into the bladder. Cystoscopy may be recommended if you have: Urinary tract infections that keep coming back. Blood in the urine (hematuria). An inability to control when you urinate (urinary incontinence) or an overactive bladder. Unusual cells found in a urine sample. A blockage in the urethra, such as a urinary stone. Painful urination. An abnormality in the bladder found during an intravenous pyelogram (IVP) or CT scan. What are the risks? Generally, this is a safe procedure. However, problems may occur, including: Infection. Bleeding.  What happens during the procedure?  You will be given one or more of the following: A medicine to numb the area (local anesthetic). The area around the opening of your urethra will be cleaned. The cystoscope will be passed through your urethra into your bladder. Germ-free (sterile) fluid will flow through the  cystoscope to fill your bladder. The fluid will stretch your bladder so that your health care provider can clearly examine your bladder walls. Your doctor will look at the urethra and bladder. The cystoscope will be removed The procedure may vary among health care providers  What can I expect after the procedure? After the procedure, it is common to have: Some soreness or pain in your urethra. Urinary symptoms. These include: Mild pain or burning when you urinate. Pain should stop within a few minutes after you urinate. This may last for up to a few days after the procedure. A small amount of blood in your urine for several days. Feeling like you need to urinate but producing only a small amount of urine. Follow these instructions at home: General instructions Return to your normal activities as told by your health care provider.  Drink plenty of fluids after the procedure. Keep all follow-up visits as told by your health care provider. This is important. Contact a health care provider if you: Have pain that gets worse or does not get better with medicine, especially pain when you urinate lasting longer than 72 hours after the procedure. Have trouble urinating. Get help right away if you: Have blood clots in your urine. Have a fever or chills. Are unable to urinate. Summary Cystoscopy is a procedure that is used to help diagnose and sometimes treat conditions that affect the lower urinary tract. Cystoscopy is done using a thin, tube-shaped instrument with a light and camera at the end. After the procedure, it is common to have some soreness or pain in your urethra. It is  normal to have blood in your urine after the procedure.  If you were prescribed an antibiotic medicine, take it as told by your health care provider.  This information is not intended to replace advice given to you by your health care provider. Make sure you discuss any questions you have with your health care  provider. Document Revised: 11/04/2018 Document Reviewed: 11/04/2018 Elsevier Patient Education  2020 [employer].

## 2024-02-26 NOTE — Progress Notes (Unsigned)
 02/26/2024 10:02 AM   Kayla Pope [birth year] [id number]  CC: Chief Complaint  Patient presents with   Recurrent UTI   HPI: Kayla Pope is a 68 y.o. 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   Today she reports ongoing bothersome vulvovaginal and urinary symptoms including urinary leakage, urgency, vulvovaginal dryness/burning/itching/stinging/sensitivity to dyes and fragrances. She denies gross hematuria or dysuria today. She used estogrn cream consistently for about a month, but stopped it when it didn't help.  She is a remote smoker with a <5 pack year history. No family history of urologic malignancy. 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  In-office UA today positive for 1+ blood and trace protein; urine microscopy with 3-10 RBCs/HPF and moderate bacteria. PVR 0mL.  PMH: Past Medical History:  Diagnosis Date   Diverticulitis    Hypertension     Surgical History: Past Surgical History:  Procedure Laterality Date   APPENDECTOMY     BREAST BIOPSY Left 06/21/2022   Stereo Bx, X clip, path pending    Home Medications:  Allergies as of 02/26/2024       Reactions   Amoxicillin Nausea Only   Potassium Chloride    Other Reaction(s): Other, Other (See Comments) Other Reaction: Not Assessed   Sulfamethoxazole-trimethoprim Nausea Only   Penicillins Nausea Only, Rash   Other Reaction(s): Other (See Comments), Other (See Comments) Other Reaction: Not Assessed Stomach pain        Medication List        Accurate as of February 26, 2024 10:02 AM. If you have any questions, ask your nurse or doctor.          STOP taking these medications    phenazopyridine 200 MG tablet Commonly known as: PYRIDIUM Stopped by: Carman Ching       TAKE these medications    diazepam 2 MG tablet Commonly known  as: Valium Take one tablet by mouth 30-60 minutes prior to cystoscopy. Do not operate heavy machinery while on this medication. Started by: Carman Ching   estradiol 0.1 MG/GM vaginal cream Commonly known as: ESTRACE Estrogen Cream Instruction Discard applicator Apply pea sized amount to tip of finger to urethra before bed. Wash hands well after application. Use every night for 2 weeks then use Monday, Wednesday and Friday forever. What changed: additional instructions Changed by: Carman Ching   lisinopril 2.5 MG tablet Commonly known as: ZESTRIL Take by mouth.   Multi-Vitamin tablet Take 1 tablet by mouth daily.        Allergies:  Allergies  Allergen Reactions   Amoxicillin Nausea Only   Potassium Chloride     Other Reaction(s): Other, Other (See Comments)  Other Reaction: Not Assessed   Sulfamethoxazole-Trimethoprim Nausea Only   Penicillins Nausea Only and Rash    Other Reaction(s): Other (See Comments), Other (See Comments)  Other Reaction: Not Assessed  Stomach pain    Family History: Family History  Problem Relation Age of Onset   Breast cancer Neg Hx     Social History:   reports that she has never smoked. She has never used smokeless tobacco. She reports that she does not currently use alcohol. She reports that she does not use drugs.  Physical Exam: BP (!) 166/97   Pulse 92   Ht 5' 2.5" (1.588 m)   Wt 187 lb (84.8 kg)  BMI 33.66 kg/m   Constitutional:  Alert and oriented, no acute distress, nontoxic appearing HEENT: [place], AT Cardiovascular: No clubbing, cyanosis, or edema Respiratory: Normal respiratory effort, no increased work of breathing GU: Patch of hypopigmented, somewhat thickened skin of the right anterior labia majora. 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 Skin: No rashes, bruises or suspicious lesions Neurologic: Grossly intact, no focal deficits, moving all 4 extremities Psychiatric: Normal mood and affect  Laboratory  Data: Results for orders placed or performed in visit on 02/26/24  Microscopic Examination   Collection Time: 02/26/24  9:17 AM   Urine  Result Value Ref Range   WBC, UA 0-5 0 - 5 /hpf   RBC, Urine 3-10 (A) 0 - 2 /hpf   Epithelial Cells (non renal) 0-10 0 - 10 /hpf   Casts Present (A) None seen /lpf   Cast Type Hyaline casts N/A   Mucus, UA Present (A) Not Estab.   Bacteria, UA Moderate (A) None seen/Few  Urinalysis, Complete   Collection Time: 02/26/24  9:17 AM  Result Value Ref Range   Specific Gravity, UA 1.025 1.005 - 1.030   pH, UA 6.0 5.0 - 7.5   Color, UA Yellow Yellow   Appearance Ur Clear Clear   Leukocytes,UA Negative Negative   Protein,UA Trace (A) Negative/Trace   Glucose, UA Negative Negative   Ketones, UA Negative Negative   RBC, UA 1+ (A) Negative   Bilirubin, UA Negative Negative   Urobilinogen, Ur 0.2 0.2 - 1.0 mg/dL   Nitrite, UA Negative Negative   Microscopic Examination See below:   BLADDER SCAN AMB NON-IMAGING   Collection Time: 02/26/24  9:28 AM  Result Value Ref Range   Scan Result 0ml   CULTURE, URINE COMPREHENSIVE   Collection Time: 02/26/24 11:09 AM   Specimen: Urine   UR  Result Value Ref Range   Urine Culture, Comprehensive Preliminary report    Organism ID, Bacteria Comment   Mycoplasma / ureaplasma culture   Collection Time: 02/26/24 11:09 AM   Specimen: Genital   UR  Result Value Ref Range   Ureaplasma urealyticum Comment Negative   Mycoplasma hominis Culture Comment Negative   Assessment & Plan:   1. Microscopic hematuria (Primary) Ongoing irritative voiding symptoms with microscopic hematuria. Will send for standard and atypical cultures to rule out infection, though low suspicion for this given prior negative cultures.   I explained that blood in the urine can be caused by a myriad of factors, including but not limited to infection, stones, cysts, anticoagulation, and urinary tract malignancies.  I explained that the recommended  work-up for blood in the urine is twofold and includes a CT urogram for evaluation of the upper urinary tract including kidneys and ureters as well as a cystoscopy for evaluation of the urethra and bladder.  I explained that these two studies complement one another in reviewing the entire urinary tract for possible causes of bleeding.  I recommended that we proceed with this at this time.  Patient agreed; CTU ordered and follow-up cysto with CTU results scheduled. I gave her a single dose of Valium to take prior to cysto and advised her to arrange to have a driver on that day. She agreed. - Urinalysis, Complete - BLADDER SCAN AMB NON-IMAGING - CULTURE, URINE COMPREHENSIVE - Mycoplasma / ureaplasma culture - diazepam (VALIUM) 2 MG tablet; Take one tablet by mouth 30-60 minutes prior to cystoscopy. Do not operate heavy machinery while on this medication.  Dispense: 1 tablet; Refill: 0 -  CT HEMATURIA WORKUP; Future  2. Genitourinary syndrome of menopause We discussed that estrogen cream can take 12 weeks of consistent use to make a difference and I urged her to restart this. She did have a hypopigmented patch on her labia today suggestive of possible lichen sclerosus, though no scarred appearance or introital stenosis consistent with this. Will see her back for symptom recheck and repeat exam on estogram cream in 3 months; if patch or symptoms are persistent, will refer to GYN for LS eval. - estradiol (ESTRACE) 0.1 MG/GM vaginal cream; Estrogen Cream Instruction Discard applicator Apply pea sized amount to tip of finger to urethra before bed. Wash hands well after application. Use every night for 2 weeks then use Monday, Wednesday and Friday forever.  Dispense: 42.5 g; Refill: 12  Return in about 6 weeks (around 04/08/2024) for Cysto/CTU results with Dr. Richardo Hanks + 3 mos symptom recheck and pelvic exam.  Carman Ching, PA-C  [facility] [address], [address] [place], [place] [address] [phone]

## 2024-03-02 LAB — CULTURE, URINE COMPREHENSIVE

## 2024-03-03 NOTE — Progress Notes (Signed)
 She grew a tiny amount of bacteria, typically less than we consider diagnostic for UTI, however given her persistent irritative voiding symptoms, lets go ahead and treat her with Macrobid 100 mg twice daily x 7 days.

## 2024-03-04 LAB — MYCOPLASMA / UREAPLASMA CULTURE
Mycoplasma hominis Culture: NEGATIVE
Ureaplasma urealyticum: NEGATIVE

## 2024-03-09 ENCOUNTER — Telehealth: Payer: Self-pay | Admitting: Physician Assistant

## 2024-03-09 MED ORDER — FOSFOMYCIN TROMETHAMINE 3 G PO PACK: 3.0000 g | PACK | Freq: Once | ORAL | 0 refills | Status: AC

## 2024-03-09 NOTE — Telephone Encounter (Signed)
Pt informed of medication sent

## 2024-03-09 NOTE — Telephone Encounter (Signed)
 Patient called wanting to confirm if her antibiotic for Macrobid 100 mg was sent to her pharmacy at [facility] in [place]. Patient states that she has not received a notification from her pharmacy as she usually does. Patient also states that when taking antibiotics she gets nauseous and would like to know if she can be prescribed a medication as well while she on the antibiotic. Please advise patient.

## 2024-03-09 NOTE — Telephone Encounter (Signed)
 Kayla Pope please advise patient

## 2024-03-09 NOTE — Addendum Note (Signed)
 Addended by: Egon Dittus P on: 03/09/2024 05:25 PM   Modules accepted: Orders

## 2024-03-11 ENCOUNTER — Ambulatory Visit
Admission: EM | Admit: 2024-03-11 | Discharge: 2024-03-11 | Disposition: A | Discharge status: Home / Self Care | Attending: Family Medicine | Admitting: Family Medicine

## 2024-03-11 ENCOUNTER — Ambulatory Visit (INDEPENDENT_AMBULATORY_CARE_PROVIDER_SITE_OTHER)

## 2024-03-11 DIAGNOSIS — R062 Wheezing: Secondary | ICD-10-CM | POA: Diagnosis not present

## 2024-03-11 DIAGNOSIS — R052 Subacute cough: Secondary | ICD-10-CM | POA: Diagnosis not present

## 2024-03-11 DIAGNOSIS — H1013 Acute atopic conjunctivitis, bilateral: Secondary | ICD-10-CM | POA: Diagnosis not present

## 2024-03-11 DIAGNOSIS — R059 Cough, unspecified: Secondary | ICD-10-CM | POA: Diagnosis not present

## 2024-03-11 MED ORDER — AZITHROMYCIN 250 MG PO TABS: ORAL_TABLET | ORAL | 0 refills | Status: DC

## 2024-03-11 MED ORDER — OLOPATADINE HCL 0.1 % OP SOLN: 1.0000 [drp] | Freq: Two times a day (BID) | OPHTHALMIC | 0 refills | Status: AC

## 2024-03-11 MED ORDER — PREDNISONE 10 MG (21) PO TBPK: ORAL_TABLET | Freq: Every day | ORAL | 0 refills | Status: DC

## 2024-03-11 NOTE — ED Triage Notes (Signed)
 Pt c/o cough & wheezing x4 days. States worse at night. Denies any sob. Has tried inhaler w/relief.

## 2024-03-11 NOTE — Discharge Instructions (Addendum)
 Your chest xray did not show evidence of pneumonia though the radiologist has not yet read it. If they find something that I didn't, I will call you.  I suspect you have bronchitis.  I prescribed some antibiotics and steroids. Stop by the pharmacy to pick up your prescriptions.  Follow up with your primary care provider or return to the urgent care, if not improving.   For your eyes: I sent in some allergy eye drops

## 2024-03-11 NOTE — ED Provider Notes (Addendum)
 [facility]    CSN: [id number] Arrival date & time: 03/11/24  1104      History   Chief Complaint Chief Complaint  Patient presents with   Cough    HPI Kayla Pope is a 68 y.o. female.   HPI  History obtained from the patient. Kayla Pope presents for cough that is worse at night. Has been wheezing. Has not been using her inhaler regularly but when she does use. Has not used her inhaler since last year.  Symptoms started months ago. Symptoms got worse in the past couple of days and has been coughing non-stop.  Took Zyrtec for itchy red eyes and slight rhinorrhea.  No fever, shortness of breath, chest pain, vomiting, diarrhea or body aches.  The humidifier helps her cough too.   She is supposed to get a procedure done by urology.        Past Medical History:  Diagnosis Date   Diverticulitis    Hypertension     Patient Active Problem List   Diagnosis Date Noted   Acute UTI 01/03/2024   Hematuria 01/03/2024   Proteinuria 01/03/2024    Past Surgical History:  Procedure Laterality Date   APPENDECTOMY     BREAST BIOPSY Left 06/21/2022   Stereo Bx, X clip, path pending    OB History   No obstetric history on file.      Home Medications    Prior to Admission medications   Medication Sig Start Date End Date Taking? Authorizing Provider  azithromycin (ZITHROMAX Z-PAK) 250 MG tablet Take 2 tablets on day 1 then 1 tablet daily 03/11/24  Yes Rochell Puett, DO  estradiol (ESTRACE) 0.1 MG/GM vaginal cream Estrogen Cream Instruction Discard applicator Apply pea sized amount to tip of finger to urethra before bed. Wash hands well after application. Use every night for 2 weeks then use Monday, Wednesday and Friday forever. 02/26/24  Yes Vaillancourt, Samantha, PA-C  lisinopril (ZESTRIL) 2.5 MG tablet Take by mouth. 11/01/23 10/31/24 Yes [provider]  Multiple Vitamin (MULTI-VITAMIN) tablet Take 1 tablet by mouth daily.   Yes [provider]   olopatadine (PATADAY) 0.1 % ophthalmic solution Place 1 drop into both eyes 2 (two) times daily. 03/11/24  Yes Tanna Loeffler, DO  predniSONE (STERAPRED UNI-PAK 21 TAB) 10 MG (21) TBPK tablet Take by mouth daily. Take 6 tabs by mouth daily for 1, then 5 tabs for 1 day, then 4 tabs for 1 day, then 3 tabs for 1 day, then 2 tabs for 1 day, then 1 tab for 1 day. 03/11/24  Yes Jahmir Salo, DO  diazepam (VALIUM) 2 MG tablet Take one tablet by mouth 30-60 minutes prior to cystoscopy. Do not operate heavy machinery while on this medication. 02/26/24   Carman Ching, PA-C    Family History Family History  Problem Relation Age of Onset   Breast cancer Neg Hx     Social History Social History   Tobacco Use   Smoking status: Never   Smokeless tobacco: Never  Vaping Use   Vaping status: Never Used  Substance Use Topics   Alcohol use: Not Currently   Drug use: Never     Allergies   Amoxicillin, Potassium chloride, Sulfamethoxazole-trimethoprim, and Penicillins   Review of Systems Review of Systems: negative unless otherwise stated in HPI.      Physical Exam Triage Vital Signs ED Triage Vitals  Encounter Vitals Group     BP      Systolic BP Percentile  Diastolic BP Percentile      Pulse      Resp      Temp      Temp src      SpO2      Weight      Height      Head Circumference      Peak Flow      Pain Score      Pain Loc      Pain Education      Exclude from Growth Chart    No data found.  Updated Vital Signs BP (!) 169/87 (BP Location: Left Arm)   Pulse 75   Temp 99.2 F (37.3 C) (Oral)   Resp 16   Ht 5' 2.5" (1.588 m)   Wt 84.8 kg   SpO2 95%   BMI 33.66 kg/m   Visual Acuity Right Eye Distance:   Left Eye Distance:   Bilateral Distance:    Right Eye Near:   Left Eye Near:    Bilateral Near:     Physical Exam GEN:     alert, non-toxic appearing female in no distress   HENT:  mucus membranes moist, no nasal discharge EYES:   pupils  equal and reactive, mild bilateral scleral injection without discharge RESP:  no increased work of breathing, faint expiratory wheezing bilaterally, coarse breathe sounds at lower left base  CVS:   regular rate and rhythm Skin:   warm and dry    UC Treatments / Results  Labs (all labs ordered are listed, but only abnormal results are displayed) Labs Reviewed - No data to display  EKG   Radiology DG Chest 2 View Result Date: 03/11/2024 CLINICAL DATA:  Cough for months. Worse over last couple of days. Wheezing EXAM: CHEST - 2 VIEW COMPARISON:  None Available. FINDINGS: No consolidation, pneumothorax or effusion. No edema. Normal cardiopericardial silhouette. Degenerative changes of the spine. IMPRESSION: No acute cardiopulmonary disease. Electronically Signed   By: Adrianna Horde M.D.   On: 03/11/2024 15:04    Procedures Procedures (including critical care time)  Medications Ordered in UC Medications - No data to display  Initial Impression / Assessment and Plan / UC Course  I have reviewed the triage vital signs and the nursing notes.  Pertinent labs & imaging results that were available during my care of the patient were reviewed by me and considered in my medical decision making (see chart for details).       Pt is a 68 y.o. female who presents for several weeks of cough that is not improving.  Acelynn is  afebrile here without recent antipyretics. Satting adequately on room air. Overall pt is  non-toxic appearing, well hydrated, without respiratory distress. Pulmonary exam is remarkable for faint expiratory wheezing bilaterally, coarse breathe sounds at lower left base  and cough.  After shared decision making, we will pursue chest x-ray. COVID  and influenza testing deferred due to length of symptoms.   Chest xray personally reviewed by me without focal pneumonia, pleural effusion, cardiomegaly or pneumothorax. Patient aware the radiologist has not read her xray and is comfortable  with the preliminary read by me. Will review radiologist read when available and call patient if a change in plan is warranted.  Pt agreeable to this plan prior to discharge.   Treat acute bronchitis with steroids and antibiotics as below. Typical duration of symptoms discussed.  Allergic Conjunctivitis: treat with Pataday drops as below    Return and ED precautions given and  patient voiced understanding. Discussed MDM, treatment plan and plan for follow-up with patient who agrees with plan.   Radiology impression reviewed    Final Clinical Impressions(s) / UC Diagnoses   Final diagnoses:  Subacute cough  Allergic conjunctivitis of both eyes     Discharge Instructions      Your chest xray did not show evidence of pneumonia though the radiologist has not yet read it. If they find something that I didn't, I will call you.  I suspect you have bronchitis.  I prescribed some antibiotics and steroids. Stop by the pharmacy to pick up your prescriptions.  Follow up with your primary care provider or return to the urgent care, if not improving.   For your eyes: I sent in some allergy eye drops         ED Prescriptions     Medication Sig Dispense Auth. Provider   olopatadine (PATADAY) 0.1 % ophthalmic solution Place 1 drop into both eyes 2 (two) times daily. 5 mL Willian Donson, DO   predniSONE (STERAPRED UNI-PAK 21 TAB) 10 MG (21) TBPK tablet Take by mouth daily. Take 6 tabs by mouth daily for 1, then 5 tabs for 1 day, then 4 tabs for 1 day, then 3 tabs for 1 day, then 2 tabs for 1 day, then 1 tab for 1 day. 21 tablet Noemy Hallmon, DO   azithromycin (ZITHROMAX Z-PAK) 250 MG tablet Take 2 tablets on day 1 then 1 tablet daily 6 tablet Ondine Gemme, DO      PDMP not reviewed this encounter.        Fidel Huddle, DO 03/11/24 1513

## 2024-03-23 ENCOUNTER — Ambulatory Visit (INDEPENDENT_AMBULATORY_CARE_PROVIDER_SITE_OTHER)

## 2024-03-23 ENCOUNTER — Encounter: Payer: Self-pay | Admitting: Family Medicine

## 2024-03-23 ENCOUNTER — Ambulatory Visit
Admission: EM | Admit: 2024-03-23 | Discharge: 2024-03-23 | Disposition: A | Discharge status: Home / Self Care | Attending: Family Medicine | Admitting: Family Medicine

## 2024-03-23 DIAGNOSIS — R051 Acute cough: Secondary | ICD-10-CM | POA: Diagnosis not present

## 2024-03-23 DIAGNOSIS — I10 Essential (primary) hypertension: Secondary | ICD-10-CM | POA: Diagnosis not present

## 2024-03-23 DIAGNOSIS — R0602 Shortness of breath: Secondary | ICD-10-CM | POA: Diagnosis not present

## 2024-03-23 DIAGNOSIS — R059 Cough, unspecified: Secondary | ICD-10-CM | POA: Diagnosis not present

## 2024-03-23 DIAGNOSIS — I517 Cardiomegaly: Secondary | ICD-10-CM | POA: Diagnosis not present

## 2024-03-23 MED ORDER — AMLODIPINE BESYLATE 2.5 MG PO TABS: 2.5000 mg | ORAL_TABLET | Freq: Every day | ORAL | 0 refills | Status: AC

## 2024-03-23 MED ORDER — HYDROCOD POLI-CHLORPHE POLI ER 10-8 MG/5ML PO SUER
5.0000 mL | Freq: Two times a day (BID) | ORAL | 0 refills | Status: DC | PRN
Start: 2024-03-23 — End: 2024-03-24

## 2024-03-23 NOTE — ED Triage Notes (Signed)
 Pt was seen 03/11/24 and is not better. The prednisone  helped, but the cough got worse. She is using her inhaler more often and has fatigue. She has tried Delsym with no relief.

## 2024-03-23 NOTE — ED Provider Notes (Signed)
 [facility]    CSN: [id number] Arrival date & time: 03/23/24  1849      History   Chief Complaint Chief Complaint  Patient presents with  . Cough  . Shortness of Breath  . Fatigue    HPI Kayla Pope is a 68 y.o. female.   HPI  History obtained from {source of history:310783}. Kayla Pope presents for ongoing cough for the past 3-4 weeks.  She has non-stop coughing since being seen here.  Took medication prescribed. Mucinex and Delsym are not helping. She is getting coughing attacks at night. Feels shortness of breath which coughing too much.  Told she woke up "very very weak and tiredness."  The prescription medications worked "from the head up."  At times has chest and rib pain.  Feels like they are inflammed.  Had fever for 3 days.     Fever : no  Chills: no Sore throat: no   Cough: no Sputum: no Chest tightness: no Shortness of breath: no Wheezing: no  Nasal congestion : no  Rhinorrhea: no Myalgias: no Appetite: normal  Hydration: normal  Abdominal pain: no Nausea: no Vomiting: no Diarrhea: No Rash: No Sleep disturbance: no Headache: no      Past Medical History:  Diagnosis Date  . Diverticulitis   . Hypertension     Patient Active Problem List   Diagnosis Date Noted  . Acute UTI 01/03/2024  . Hematuria 01/03/2024  . Proteinuria 01/03/2024    Past Surgical History:  Procedure Laterality Date  . APPENDECTOMY    . BREAST BIOPSY Left 06/21/2022   Stereo Bx, X clip, path pending    OB History   No obstetric history on file.      Home Medications    Prior to Admission medications   Medication Sig Start Date End Date Taking? Authorizing Provider  azithromycin  (ZITHROMAX  Z-PAK) 250 MG tablet Take 2 tablets on day 1 then 1 tablet daily 03/11/24   Elyce Zollinger, DO  diazepam  (VALIUM ) 2 MG tablet Take one tablet by mouth 30-60 minutes prior to cystoscopy. Do not operate heavy machinery while on this medication. 02/26/24    Vaillancourt, Samantha, PA-C  estradiol  (ESTRACE ) 0.1 MG/GM vaginal cream Estrogen Cream Instruction Discard applicator Apply pea sized amount to tip of finger to urethra before bed. Wash hands well after application. Use every night for 2 weeks then use Monday, Wednesday and Friday forever. 02/26/24   Vaillancourt, Samantha, PA-C  lisinopril (ZESTRIL) 2.5 MG tablet Take by mouth. 11/01/23 10/31/24  [provider]  Multiple Vitamin (MULTI-VITAMIN) tablet Take 1 tablet by mouth daily.    [provider]  olopatadine  (PATADAY ) 0.1 % ophthalmic solution Place 1 drop into both eyes 2 (two) times daily. 03/11/24   Quintez Maselli, DO  predniSONE  (STERAPRED UNI-PAK 21 TAB) 10 MG (21) TBPK tablet Take by mouth daily. Take 6 tabs by mouth daily for 1, then 5 tabs for 1 day, then 4 tabs for 1 day, then 3 tabs for 1 day, then 2 tabs for 1 day, then 1 tab for 1 day. 03/11/24   Ninfa Giannelli, DO    Family History Family History  Problem Relation Age of Onset  . Breast cancer Neg Hx     Social History Social History   Tobacco Use  . Smoking status: Never  . Smokeless tobacco: Never  Vaping Use  . Vaping status: Never Used  Substance Use Topics  . Alcohol use: Not Currently  . Drug use: Never  Allergies   Amoxicillin, Potassium chloride, Sulfamethoxazole-trimethoprim, and Penicillins   Review of Systems Review of Systems: negative unless otherwise stated in HPI.      Physical Exam Triage Vital Signs ED Triage Vitals [03/23/24 1905]  Encounter Vitals Group     BP (!) 169/105     Systolic BP Percentile      Diastolic BP Percentile      Pulse Rate 94     Resp 18     Temp 99.2 F (37.3 C)     Temp Source Oral     SpO2 97 %     Weight      Height      Head Circumference      Peak Flow      Pain Score 0     Pain Loc      Pain Education      Exclude from Growth Chart    No data found.  Updated Vital Signs BP (!) 169/105 (BP Location: Left Arm)   Pulse 94    Temp 99.2 F (37.3 C) (Oral)   Resp 18   SpO2 97%   Visual Acuity Right Eye Distance:   Left Eye Distance:   Bilateral Distance:    Right Eye Near:   Left Eye Near:    Bilateral Near:     Physical Exam GEN:     alert, non-toxic appearing female in no distress ***   HENT:  mucus membranes moist, oropharyngeal ***without lesions or ***erythema, no*** tonsillar hypertrophy or exudates, *** moderate erythematous edematous turbinates, ***clear nasal discharge, ***bilateral TM normal EYES:   pupils equal and reactive, ***no scleral injection or discharge NECK:  normal ROM, no ***lymphadenopathy, ***no meningismus   RESP:  no increased work of breathing, ***clear to auscultation bilaterally CVS:   regular rate ***and rhythm Skin:   warm and dry, no rash on visible skin***    UC Treatments / Results  Labs (all labs ordered are listed, but only abnormal results are displayed) Labs Reviewed - No data to display  EKG   Radiology No results found.  Procedures Procedures (including critical care time)  Medications Ordered in UC Medications - No data to display  Initial Impression / Assessment and Plan / UC Course  I have reviewed the triage vital signs and the nursing notes.  Pertinent labs & imaging results that were available during my care of the patient were reviewed by me and considered in my medical decision making (see chart for details).       Pt is a 68 y.o. female who presents for *** days of respiratory symptoms. Kayla Pope is ***afebrile here without recent antipyretics. Satting well on room air. Overall pt is ***non-toxic appearing, well hydrated, without respiratory distress. Pulmonary exam ***is unremarkable.  COVID and influenza panel obtained ***and was negative. ***Pt to quarantine until COVID test results or longer if positive.  I will call patient with test results, if positive. History consistent with ***viral respiratory illness. Discussed symptomatic treatment.   Explained lack of efficacy of antibiotics in viral disease.  Typical duration of symptoms discussed.   Return and ED precautions given and voiced understanding. Discussed MDM, treatment plan and plan for follow-up with patient*** who agrees with plan.     Final Clinical Impressions(s) / UC Diagnoses   Final diagnoses:  None   Discharge Instructions   None    ED Prescriptions   None    PDMP not reviewed this encounter.

## 2024-03-23 NOTE — Discharge Instructions (Addendum)
 I stopped your Lisinopril today to see if this is the cause of your cough.  Start amlodipine 2.5 mg daily.  Lisinopril related cough may last up to a month after stopping the medication.   I sent a cough syrup to help you sleep to your pharmacy.   Follow up with your primary care provider.

## 2024-03-24 ENCOUNTER — Telehealth: Payer: Self-pay | Admitting: Emergency Medicine

## 2024-03-24 MED ORDER — PROMETHAZINE-DM 6.25-15 MG/5ML PO SYRP: 5.0000 mL | ORAL_SOLUTION | Freq: Four times a day (QID) | ORAL | 0 refills | Status: AC | PRN

## 2024-03-24 NOTE — Telephone Encounter (Signed)
 Tussionex too expensive.  Will prescribe Promethazine DM.

## 2024-03-30 DIAGNOSIS — R319 Hematuria, unspecified: Secondary | ICD-10-CM | POA: Diagnosis not present

## 2024-03-30 DIAGNOSIS — R0600 Dyspnea, unspecified: Secondary | ICD-10-CM | POA: Diagnosis not present

## 2024-03-30 DIAGNOSIS — R058 Other specified cough: Secondary | ICD-10-CM | POA: Diagnosis not present

## 2024-03-30 DIAGNOSIS — R5383 Other fatigue: Secondary | ICD-10-CM | POA: Diagnosis not present

## 2024-04-23 ENCOUNTER — Other Ambulatory Visit: Admitting: Urology

## 2024-05-28 ENCOUNTER — Ambulatory Visit: Admitting: Physician Assistant

## 2024-07-29 DIAGNOSIS — H1013 Acute atopic conjunctivitis, bilateral: Secondary | ICD-10-CM | POA: Diagnosis not present

## 2024-08-20 DIAGNOSIS — I10 Essential (primary) hypertension: Secondary | ICD-10-CM | POA: Diagnosis not present

## 2024-08-20 DIAGNOSIS — E782 Mixed hyperlipidemia: Secondary | ICD-10-CM | POA: Diagnosis not present

## 2024-08-20 DIAGNOSIS — Z1331 Encounter for screening for depression: Secondary | ICD-10-CM | POA: Diagnosis not present

## 2024-08-20 DIAGNOSIS — Z0000 Encounter for general adult medical examination without abnormal findings: Secondary | ICD-10-CM | POA: Diagnosis not present

## 2024-08-20 DIAGNOSIS — H579 Unspecified disorder of eye and adnexa: Secondary | ICD-10-CM | POA: Diagnosis not present

## 2024-08-20 DIAGNOSIS — C91Z0 Other lymphoid leukemia not having achieved remission: Secondary | ICD-10-CM | POA: Diagnosis not present

## 2024-08-20 DIAGNOSIS — L292 Pruritus vulvae: Secondary | ICD-10-CM | POA: Diagnosis not present

## 2024-08-20 DIAGNOSIS — R7303 Prediabetes: Secondary | ICD-10-CM | POA: Diagnosis not present

## 2024-09-02 DIAGNOSIS — H04123 Dry eye syndrome of bilateral lacrimal glands: Secondary | ICD-10-CM | POA: Diagnosis not present

## 2024-09-02 DIAGNOSIS — H1013 Acute atopic conjunctivitis, bilateral: Secondary | ICD-10-CM | POA: Diagnosis not present

## 2024-09-11 NOTE — Progress Notes (Addendum)
 Kayla Pope                                          MRN: [id number]   09/11/2024   The [facility] Quality Team Specialist reviewed this patient medical record for the purposes of chart review for care gap closure. The following were reviewed: chart review for care gap closure-controlling blood pressure.   10/30/2024- NO NEW CBP    [facility] Quality Team

## 2024-09-16 DIAGNOSIS — H16223 Keratoconjunctivitis sicca, not specified as Sjogren's, bilateral: Secondary | ICD-10-CM | POA: Diagnosis not present

## 2024-09-16 DIAGNOSIS — H5713 Ocular pain, bilateral: Secondary | ICD-10-CM | POA: Diagnosis not present

## 2024-09-16 DIAGNOSIS — H18593 Other hereditary corneal dystrophies, bilateral: Secondary | ICD-10-CM | POA: Diagnosis not present

## 2024-09-22 DIAGNOSIS — J301 Allergic rhinitis due to pollen: Secondary | ICD-10-CM | POA: Diagnosis not present
